# Patient Record
Sex: Male | Born: 1940 | Race: Black or African American | Hispanic: No | Smoking: Never smoker
Health system: Southern US, Community
[De-identification: ages and names within clinical notes are randomized; demographics above are authoritative.]

## PROBLEM LIST (undated history)

## (undated) DIAGNOSIS — R251 Tremor, unspecified: Secondary | ICD-10-CM

## (undated) DIAGNOSIS — I10 Essential (primary) hypertension: Secondary | ICD-10-CM

## (undated) DIAGNOSIS — I493 Ventricular premature depolarization: Secondary | ICD-10-CM

## (undated) DIAGNOSIS — E119 Type 2 diabetes mellitus without complications: Secondary | ICD-10-CM

## (undated) DIAGNOSIS — N182 Chronic kidney disease, stage 2 (mild): Secondary | ICD-10-CM

## (undated) DIAGNOSIS — F419 Anxiety disorder, unspecified: Secondary | ICD-10-CM

## (undated) DIAGNOSIS — R079 Chest pain, unspecified: Secondary | ICD-10-CM

## (undated) HISTORY — DX: Anxiety disorder, unspecified: F41.9

## (undated) HISTORY — PX: HERNIA REPAIR: SHX51

## (undated) HISTORY — DX: Ventricular premature depolarization: I49.3

## (undated) HISTORY — DX: Chronic kidney disease, stage 2 (mild): N18.2

## (undated) HISTORY — DX: Chest pain, unspecified: R07.9

---

## 2001-02-06 ENCOUNTER — Encounter: Payer: Self-pay | Admitting: Emergency Medicine

## 2001-02-06 ENCOUNTER — Emergency Department (HOSPITAL_COMMUNITY): Admission: EM | Admit: 2001-02-06 | Discharge: 2001-02-06 | Payer: Self-pay | Admitting: Emergency Medicine

## 2010-06-09 ENCOUNTER — Emergency Department (HOSPITAL_COMMUNITY): Admission: EM | Admit: 2010-06-09 | Discharge: 2010-06-09 | Payer: Self-pay | Admitting: Emergency Medicine

## 2010-07-29 ENCOUNTER — Ambulatory Visit (HOSPITAL_COMMUNITY): Admission: RE | Admit: 2010-07-29 | Discharge: 2010-07-29 | Payer: Self-pay | Admitting: General Surgery

## 2011-03-07 LAB — CBC
HCT: 44.7 % (ref 39.0–52.0)
Hemoglobin: 14.5 g/dL (ref 13.0–17.0)
MCH: 22.8 pg — ABNORMAL LOW (ref 26.0–34.0)
MCHC: 32.4 g/dL (ref 30.0–36.0)
MCV: 70.4 fL — ABNORMAL LOW (ref 78.0–100.0)
Platelets: 164 10*3/uL (ref 150–400)
RBC: 6.35 MIL/uL — ABNORMAL HIGH (ref 4.22–5.81)
RDW: 14.5 % (ref 11.5–15.5)
WBC: 4 10*3/uL (ref 4.0–10.5)

## 2011-03-07 LAB — BASIC METABOLIC PANEL
BUN: 9 mg/dL (ref 6–23)
CO2: 32 mEq/L (ref 19–32)
Calcium: 9.7 mg/dL (ref 8.4–10.5)
Chloride: 103 mEq/L (ref 96–112)
Creatinine, Ser: 1.1 mg/dL (ref 0.4–1.5)
GFR calc Af Amer: >60 mL/min (ref >60)
GFR calc non Af Amer: >60 mL/min (ref >60)
Glucose, Bld: 117 mg/dL — ABNORMAL HIGH (ref 70–99)
Potassium: 4.1 mEq/L (ref 3.5–5.1)
Sodium: 141 mEq/L (ref 135–145)

## 2011-03-07 LAB — SURGICAL PCR SCREEN
MRSA, PCR: NEGATIVE
Staphylococcus aureus: NEGATIVE

## 2011-03-07 LAB — GLUCOSE, CAPILLARY
Glucose-Capillary: 146 mg/dL — ABNORMAL HIGH (ref 70–99)
Glucose-Capillary: 146 mg/dL — ABNORMAL HIGH (ref 70–99)

## 2011-03-09 LAB — DIFFERENTIAL
Lymphs Abs: 1.7 10*3/uL (ref 0.7–4.0)
Monocytes Relative: 13 % — ABNORMAL HIGH (ref 3–12)
Neutro Abs: 2.4 10*3/uL (ref 1.7–7.7)
Neutrophils Relative %: 51 % (ref 43–77)

## 2011-03-09 LAB — URINALYSIS, ROUTINE W REFLEX MICROSCOPIC
Ketones, ur: NEGATIVE mg/dL
Leukocytes, UA: NEGATIVE
Nitrite: NEGATIVE
Specific Gravity, Urine: 1.02 (ref 1.005–1.030)
Urobilinogen, UA: 1 mg/dL (ref 0.0–1.0)
pH: 6 (ref 5.0–8.0)

## 2011-03-09 LAB — COMPREHENSIVE METABOLIC PANEL
BUN: 12 mg/dL (ref 6–23)
CO2: 29 mEq/L (ref 19–32)
Calcium: 9.1 mg/dL (ref 8.4–10.5)
Chloride: 100 mEq/L (ref 96–112)
Creatinine, Ser: 1.2 mg/dL (ref 0.4–1.5)
GFR calc Af Amer: >60 mL/min (ref >60)
Potassium: 2.8 mEq/L — ABNORMAL LOW (ref 3.5–5.1)
Sodium: 138 mEq/L (ref 135–145)
Total Protein: 7.2 g/dL (ref 6.0–8.3)

## 2011-03-09 LAB — GLUCOSE, CAPILLARY: Glucose-Capillary: 221 mg/dL — ABNORMAL HIGH (ref 70–99)

## 2011-03-09 LAB — URINE MICROSCOPIC-ADD ON

## 2011-03-09 LAB — CBC
MCV: 71.7 fL — ABNORMAL LOW (ref 78.0–100.0)
Platelets: 162 10*3/uL (ref 150–400)
RBC: 6.01 MIL/uL — ABNORMAL HIGH (ref 4.22–5.81)
WBC: 4.8 10*3/uL (ref 4.0–10.5)

## 2011-03-09 LAB — LACTIC ACID, PLASMA: Lactic Acid, Venous: 2.6 mmol/L — ABNORMAL HIGH (ref 0.5–2.2)

## 2013-11-14 ENCOUNTER — Other Ambulatory Visit: Payer: Self-pay | Admitting: Family Medicine

## 2013-11-14 ENCOUNTER — Ambulatory Visit
Admission: RE | Admit: 2013-11-14 | Discharge: 2013-11-14 | Disposition: A | Discharge status: Home / Self Care | Payer: Medicare Other | Source: Ambulatory Visit | Attending: Family Medicine | Admitting: Family Medicine

## 2013-11-14 DIAGNOSIS — R079 Chest pain, unspecified: Secondary | ICD-10-CM

## 2015-04-21 ENCOUNTER — Emergency Department (HOSPITAL_COMMUNITY)
Admission: EM | Admit: 2015-04-21 | Discharge: 2015-04-21 | Disposition: A | Discharge status: Home / Self Care | Payer: Medicare Other | Attending: Emergency Medicine | Admitting: Emergency Medicine

## 2015-04-21 ENCOUNTER — Emergency Department (HOSPITAL_COMMUNITY): Payer: Medicare Other

## 2015-04-21 ENCOUNTER — Encounter (HOSPITAL_COMMUNITY): Payer: Self-pay | Admitting: Emergency Medicine

## 2015-04-21 DIAGNOSIS — E119 Type 2 diabetes mellitus without complications: Secondary | ICD-10-CM | POA: Insufficient documentation

## 2015-04-21 DIAGNOSIS — Z79899 Other long term (current) drug therapy: Secondary | ICD-10-CM | POA: Insufficient documentation

## 2015-04-21 DIAGNOSIS — I10 Essential (primary) hypertension: Secondary | ICD-10-CM | POA: Insufficient documentation

## 2015-04-21 DIAGNOSIS — R109 Unspecified abdominal pain: Secondary | ICD-10-CM | POA: Diagnosis present

## 2015-04-21 DIAGNOSIS — M545 Low back pain: Secondary | ICD-10-CM | POA: Insufficient documentation

## 2015-04-21 DIAGNOSIS — Z794 Long term (current) use of insulin: Secondary | ICD-10-CM | POA: Insufficient documentation

## 2015-04-21 HISTORY — DX: Essential (primary) hypertension: I10

## 2015-04-21 HISTORY — DX: Type 2 diabetes mellitus without complications: E11.9

## 2015-04-21 HISTORY — DX: Tremor, unspecified: R25.1

## 2015-04-21 LAB — URINALYSIS, ROUTINE W REFLEX MICROSCOPIC
BILIRUBIN URINE: NEGATIVE
Glucose, UA: >1000 mg/dL — AB
Hgb urine dipstick: NEGATIVE
KETONES UR: NEGATIVE mg/dL
Leukocytes, UA: NEGATIVE
NITRITE: NEGATIVE
PROTEIN: NEGATIVE mg/dL
Specific Gravity, Urine: 1.014 (ref 1.005–1.030)
UROBILINOGEN UA: 0.2 mg/dL (ref 0.0–1.0)
pH: 6 (ref 5.0–8.0)

## 2015-04-21 LAB — CBC
HEMATOCRIT: 43.5 % (ref 39.0–52.0)
HEMOGLOBIN: 14.4 g/dL (ref 13.0–17.0)
MCH: 22.9 pg — ABNORMAL LOW (ref 26.0–34.0)
MCHC: 33.1 g/dL (ref 30.0–36.0)
MCV: 69.3 fL — ABNORMAL LOW (ref 78.0–100.0)
Platelets: 183 10*3/uL (ref 150–400)
RBC: 6.28 MIL/uL — AB (ref 4.22–5.81)
RDW: 15.2 % (ref 11.5–15.5)
WBC: 4.5 10*3/uL (ref 4.0–10.5)

## 2015-04-21 LAB — URINE MICROSCOPIC-ADD ON: Urine-Other: NONE SEEN

## 2015-04-21 LAB — BASIC METABOLIC PANEL
Anion gap: 5 (ref 5–15)
BUN: 16 mg/dL (ref 6–23)
CO2: 26 mmol/L (ref 19–32)
CREATININE: 1.26 mg/dL (ref 0.50–1.35)
Calcium: 8.8 mg/dL (ref 8.4–10.5)
Chloride: 101 mmol/L (ref 96–112)
GFR, EST AFRICAN AMERICAN: 63 mL/min — AB (ref >90)
GFR, EST NON AFRICAN AMERICAN: 54 mL/min — AB (ref >90)
GLUCOSE: 239 mg/dL — AB (ref 70–99)
POTASSIUM: 5.6 mmol/L — AB (ref 3.5–5.1)
Sodium: 132 mmol/L — ABNORMAL LOW (ref 135–145)

## 2015-04-21 MED ORDER — NAPROXEN 500 MG PO TABS: 500.0000 mg | ORAL_TABLET | Freq: Two times a day (BID) | ORAL | Status: DC

## 2015-04-21 NOTE — ED Provider Notes (Signed)
CSN: [id number]     Arrival date & time 04/21/15  1235 History   First MD Initiated Contact with Patient 04/21/15 1305     Chief Complaint  Patient presents with  . Flank Pain     (Consider location/radiation/quality/duration/timing/severity/associated sxs/prior Treatment) HPI Comments: The patient is a 74 year old male, he has a history of hypertension and diabetes, he presents with right-sided lower back pain which has been going on for just over 1 week. He denies any urinary symptoms, denies fevers chills nausea vomiting or diarrhea, the pain seems to get better during the day when he takes pain medications, he gets worse when he lays down at night. There is no associated difficulty ambulating, urinary retention, no history of cancer, no fevers, no pain or numbness in his legs.  Patient is a 74 y.o. male presenting with flank pain. The history is provided by the patient.  Flank Pain    Past Medical History  Diagnosis Date  . Hypertension   . Diabetes mellitus without complication   . Occasional tremors    Past Surgical History  Procedure Laterality Date  . Hernia repair     No family history on file. History  Substance Use Topics  . Smoking status: Never Smoker   . Smokeless tobacco: Not on file  . Alcohol Use: No    Review of Systems  Genitourinary: Positive for flank pain.  All other systems reviewed and are negative.     Allergies  Review of patient's allergies indicates no known allergies.  Home Medications   Prior to Admission medications   Medication Sig Start Date End Date Taking? Authorizing Provider  amLODipine (NORVASC) 10 MG tablet Take 10 mg by mouth daily. 03/29/15  Yes Historical Provider, MD  clonazePAM (KLONOPIN) 1 MG tablet Take 1 mg by mouth 2 (two) times daily. 03/29/15  Yes Historical Provider, MD  insulin detemir (LEVEMIR) 100 UNIT/ML injection Inject 35 Units into the skin daily.   Yes Historical Provider, MD  losartan (COZAAR) 100 MG tablet  Take 100 mg by mouth daily.  03/29/15  Yes Historical Provider, MD  naproxen (NAPROSYN) 500 MG tablet Take 1 tablet (500 mg total) by mouth 2 (two) times daily with a meal. 04/21/15   Eber HongBrian Natane Heward, MD   BP 148/70 mmHg  Pulse 83  Temp(Src) 98 F (36.7 C)  Resp 18  SpO2 100% Physical Exam  Constitutional: He appears well-developed and well-nourished. No distress.  HENT:  Head: Normocephalic and atraumatic.  Mouth/Throat: Oropharynx is clear and moist. No oropharyngeal exudate.  Eyes: Conjunctivae and EOM are normal. Pupils are equal, round, and reactive to light. Right eye exhibits no discharge. Left eye exhibits no discharge. No scleral icterus.  Neck: Normal range of motion. Neck supple. No JVD present. No thyromegaly present.  Cardiovascular: Normal rate, regular rhythm, normal heart sounds and intact distal pulses.  Exam reveals no gallop and no friction rub.   No murmur heard. Pulmonary/Chest: Effort normal and breath sounds normal. No respiratory distress. He has no wheezes. He has no rales.  Abdominal: Soft. Bowel sounds are normal. He exhibits no distension and no mass. There is no tenderness.  No palpable abdominal aneurysm or other mass, no tenderness  Musculoskeletal: Normal range of motion. He exhibits tenderness (tender to palpation over the right flank and lower back). He exhibits no edema.  Lymphadenopathy:    He has no cervical adenopathy.  Neurological: He is alert. Coordination normal.  Skin: Skin is warm and dry. No rash noted.  No erythema.  Psychiatric: He has a normal mood and affect. His behavior is normal.  Nursing note and vitals reviewed.   ED Course  Procedures (including critical care time) Labs Review Labs Reviewed  URINALYSIS, ROUTINE W REFLEX MICROSCOPIC - Abnormal; Notable for the following:    Glucose, UA >1000 (*)    All other components within normal limits  CBC - Abnormal; Notable for the following:    RBC 6.28 (*)    MCV 69.3 (*)    MCH 22.9 (*)     All other components within normal limits  BASIC METABOLIC PANEL - Abnormal; Notable for the following:    Sodium 132 (*)    Potassium 5.6 (*)    Glucose, Bld 239 (*)    GFR calc non Af Amer 54 (*)    GFR calc Af Amer 63 (*)    All other components within normal limits  URINE MICROSCOPIC-ADD ON    Imaging Review Ct Renal Stone Study  04/21/2015   CLINICAL DATA:  Bilateral flank pain, right greater than left, x1 week  EXAM: CT ABDOMEN AND PELVIS WITHOUT CONTRAST  TECHNIQUE: Multidetector CT imaging of the abdomen and pelvis was performed following the standard protocol without IV contrast.  COMPARISON:  06/09/2010  FINDINGS: Lower chest:  Mild dependent atelectasis the lung bases.  Hepatobiliary: 2.5 cm cyst in the lateral segment left hepatic lobe (series 2/image 14), previously 1.4 cm, benign.  Gallbladder is unremarkable. No intrahepatic or extrahepatic ductal dilatation.  Pancreas: Within normal limits.  Spleen: Within normal limits.  Adrenals/Urinary Tract: Adrenal glands are within normal limits.  1.8 cm probable cyst in the posterior left upper kidney (series 2/ image 25). Additional 1.2 cm probable cyst in the posterior left upper pole (series 2/image 19). Right kidney is unremarkable.  No renal, ureteral, or bladder calculi.  No hydronephrosis.  Bladder is mildly thick-walled although underdistended.  Stomach/Bowel: Stomach is within normal limits.  No evidence of bowel obstruction.  Normal appendix.  Vascular/Lymphatic: New mild atherosclerotic calcifications of the abdominal aorta.  No suspicious abdominopelvic lymphadenopathy.  Reproductive: Prostatomegaly, with enlargement of the central gland which indents the base of the bladder.  Other: No abdominopelvic ascites.  Fat within the bilateral inguinal canals.  Musculoskeletal: Degenerative changes of the visualized thoracolumbar spine.  IMPRESSION: No renal, ureteral, or bladder calculi.  No hydronephrosis.  No evidence of bowel  obstruction.  Normal appendix.  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  No CT findings to account for the patient's bilateral flank pain.   Electronically Signed   By: Charline Bills M.D.   On: 04/21/2015 14:22      MDM   Final diagnoses:  Flank pain    Will need to evaluate for kidney stone, AAA, possibly musculoskeletal pain, the patient has a normal neurologic exam and has no midline tenderness or risk factors for pathologic back pain other than age.  CT scan negative for acute findings, labs unremarkable, the patient appears stable for discharge  Meds given in ED:  Medications - No data to display  New Prescriptions   NAPROXEN (NAPROSYN) 500 MG TABLET    Take 1 tablet (500 mg total) by mouth 2 (two) times daily with a meal.      Eber Hong, MD 04/21/15 [phone]

## 2015-04-21 NOTE — ED Notes (Signed)
Per pt, states flank pain for over a week-no dysuria

## 2015-04-21 NOTE — Discharge Instructions (Signed)
Back Pain: ° ° °Your back pain should be treated with medicines such as ibuprofen or aleve and this back pain should get better over the next 2 weeks.  However if you develop severe or worsening pain, low back pain with fever, numbness, weakness or inability to walk or urinate, you should return to the ER immediately.  Please follow up with your doctor this week for a recheck if still having symptoms. °Low back pain is discomfort in the lower back that may be due to injuries to muscles and ligaments around the spine.  Occasionally, it may be caused by a a problem to a part of the spine called a disc.  The pain may last several days or a week;  However, most patients get completely well in 4 weeks. ° °Self - care:  The application of heat can help soothe the pain.  Maintaining your daily activities, including walking, is encourged, as it will help you get better faster than just staying in bed. ° °Medications are also useful to help with pain control.  A commonly prescribed medications includes acetaminophen.  This medication is generally safe, though you should not take more than 8 of the extra strength (500mg) pills a day. ° °Non steroidal anti inflammatory medications including Ibuprofen and naproxen;  These medications help both pain and swelling and are very useful in treating back pain.  They should be taken with food, as they can cause stomach upset, and more seriously, stomach bleeding.   ° °Muscle relaxants:  These medications can help with muscle tightness that is a cause of lower back pain.  Most of these medications can cause drowsiness, and it is not safe to drive or use dangerous machinery while taking them. ° °You will need to follow up with  Your primary healthcare provider in 1-2 weeks for reassessment. ° °Be aware that if you develop new symptoms, such as a fever, leg weakness, difficulty with or loss of control of your urine or bowels, abdominal pain, or more severe pain, you will need to seek  medical attention and  / or return to the Emergency department. ° °If you do not have a doctor see the list below. ° °RESOURCE GUIDE ° °Chronic Pain Problems: °Contact [facility]  [phone] °Patients need to be referred by their primary care doctor. ° °Insufficient Money for Medicine: °Contact United Way:  call "211" or [facility] 271-5999. ° °No Primary Care Doctor: °- Call [facility]  832-8000 - can help you locate a primary care doctor that  accepts your insurance, provides certain services, etc. °- Physician Referral Service- 1-800-533-3463 ° °Agencies that provide inexpensive medical care: °- [facility]  [phone] °-  Internal Medicine  [phone] °- [facility]  [phone] °- Women's Clinic  832-4777 °- [facility]  [phone] °- [facility]  [phone] ° °Medicaid-accepting [place] Providers: °- [facility]- [address] ° [phone], Mon-Fri 9am-7pm, Sat 9am-1pm °- [facility]- [address], [address] ° [phone] °- [facility]- [address], [address] ° [phone] °- [facility]- [address] [place] [address] ° [phone] °- [facility]- [address], [phone] ° Only accepts [place] Access Medicaid patients after they have their name  applied to their card ° °Self Pay (no insurance) in [place]: °- Sickle Cell Patients: Dr Eric Dean, [facility] °   [address], [phone] °- [facility]- [address] ° [phone] °      -     [facility] [place]- [address] °      -     [facility]- see information above (Speak to Pam H if you do not have insurance) °      -  [facility]- [address], [phone] °      -  [facility] [place]- [address],  [phone] °      -  [facility]- [address] [place] [address],  [phone] °      -  Dr Osei-Bonsu-  [address], [place], [phone] °      -  [facility]- [address], [phone] °      -  [facility]- [address] [place] [address], [phone], also [address], [phone] °      -    [facility]- [address], [phone], 1st & 3rd Saturday   every month, 10am-1pm ° °1) Find a Doctor and Pay Out of Pocket °Although you won't have to find out who is covered by your insurance plan, it is a good idea to ask around and get recommendations. You will then need to call the office and see if the doctor you have chosen will accept you as a new patient and what types of options they offer for patients who are self-pay. Some doctors offer discounts or will set up payment plans for their patients who do not have insurance, but you will need to ask so you aren't surprised when you get to your appointment. ° °2) Contact Your Local Health Department °Not all health departments have doctors that can see patients for sick visits, but many do, so it is worth a call to see if yours does. If you don't know where your local health department is, you can check in your phone book. The CDC also has a tool to help you locate your state's health department, and many state websites also have listings of all of their local health departments. ° °3) Find a Walk-in Clinic °If your illness is not likely to be very severe or complicated, you may want to try a walk in clinic. These are popping up all over the country in pharmacies, drugstores, and shopping centers. They're usually staffed by nurse practitioners or physician assistants that have been trained to treat common illnesses and complaints. They're usually fairly quick and inexpensive. However, if you have serious medical issues or chronic medical problems, these are probably not your best option ° °STD Testing °- [place] [facility] [place], [facility], [address], [place], phone [phone] or 1-877-539-9860.  Monday - Friday, call for an appointment. °- [place] [facility] [place], [facility], [address], [place], phone [phone] or 1-877-539-9860.  Monday - Friday, call for an appointment. ° °Abuse/Neglect: °- [place] Child Abuse Hotline (336) 641-3795 °- [place] Child Abuse Hotline 800-378-5315 (After Hours) ° °Emergency Shelter:  [facility] (336) 271-5985 ° °Maternity Homes: °- [facility] (336) 275-9566 °- [facility] (704) 372-4663 ° °MRSA Hotline #:   832-7006 ° °[place] Resources ° °[facility] [place]  [employer] [place] [facility] °[address]                 [address]         [address]  °[place]                                               [place]                              [place] °Phone:  349-3220                                  Phone:  342-7768                   Phone:  342-8140 ° °[facility], 342-8316 °- [place] [facility] - [facility]- 1-888-581-9988 °      -     [facility] in [place], [address],                                  336-349-4454, Insurance ° °[place] Child Abuse Hotline °(336) 342-1394 or (336) 342-3537 (After Hours) ° ° °Behavioral Health Services ° °Substance Abuse Resources: °- [facility]  336-882-2125 °- [facility] 336-784-9470 °- [facility] 336-285-9073 °- [facility] 336-845-3988 °- Residential & Outpatient Substance Abuse Program  800-659-3381 ° °Psychological Services: °- [facility]  832-9600 °- [facility]  378-7881 °- [facility], [address], [place], ACCESS LINE: 1-800-853-5163 or 336-641-4981, Http://www.[facility]/services/adult.htm ° °Dental Assistance ° °If unable to pay or  uninsured, contact:  [facility] or [place] [facility] to become qualified for the adult dental clinic. ° °Patients with Medicaid: [facility] °[address], [phone] °[address], [phone] ° °If unable to pay, or uninsured, contact [facility] (271-5999) or [place] [facility] (641-3152 in [place], 842-7733 in [place]) to become qualified for the adult dental clinic ° °Other Low-Cost Community Dental Services: °- [facility]- [address], [place], [place], [address], 723-1848, Ext. [address], 2nd and 4th Thursday of the month at 6:30am.  10 clients each day by appointment, can sometimes see walk-in patients if someone does not show for an appointment. °- [facility]- [address], [place], [place], [address], [phone] °- [facility]- [address], [place], , [address], [phone] °- [facility] [facility]- 342-8273 °- [facility]- 703-3100 °- [facility]- 570-6415 ° ° ° ° ° ° °

## 2017-07-15 ENCOUNTER — Telehealth: Payer: Self-pay

## 2017-07-15 NOTE — Telephone Encounter (Signed)
NOTES SENT TO SCHELDING 

## 2017-07-27 ENCOUNTER — Encounter: Payer: Self-pay | Admitting: Cardiology

## 2017-08-13 ENCOUNTER — Encounter: Payer: Self-pay | Admitting: Cardiology

## 2017-08-13 ENCOUNTER — Ambulatory Visit (INDEPENDENT_AMBULATORY_CARE_PROVIDER_SITE_OTHER): Payer: Medicare Other | Admitting: Cardiology

## 2017-08-13 VITALS — BP 146/92 | HR 80 | Ht 72.0 in | Wt 196.0 lb

## 2017-08-13 DIAGNOSIS — R42 Dizziness and giddiness: Secondary | ICD-10-CM

## 2017-08-13 DIAGNOSIS — Z794 Long term (current) use of insulin: Secondary | ICD-10-CM

## 2017-08-13 DIAGNOSIS — R002 Palpitations: Secondary | ICD-10-CM

## 2017-08-13 DIAGNOSIS — E119 Type 2 diabetes mellitus without complications: Secondary | ICD-10-CM

## 2017-08-13 DIAGNOSIS — E782 Mixed hyperlipidemia: Secondary | ICD-10-CM | POA: Diagnosis not present

## 2017-08-13 DIAGNOSIS — R079 Chest pain, unspecified: Secondary | ICD-10-CM

## 2017-08-13 DIAGNOSIS — I10 Essential (primary) hypertension: Secondary | ICD-10-CM | POA: Diagnosis not present

## 2017-08-13 NOTE — Progress Notes (Signed)
.   Cardiology Office Note   Date:  08/13/2017   ID:  [id number] DOB October 14, 1941, MRN [id number]  PCP:  [facility] [place] [facility] Laurann Montana Cardiologist:  [facility]     Chief Complaint  Patient presents with  . Chest Pain    dizziness  . Palpitations      History of Present Illness: Ronald Ramirez is a 76 y.o. male who is being seen today for the evaluation of irregular HR  at the request of Laurann Montana, MD.   He has been having irregular HR for 20 years.  Described as skipped beats.   Also some chest pain.  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  Hx of anxiety.  HLD, BPH.   Today he has no complaints,but is here for 2 episodes of irregular rapid HR and dizziness associated with chest pain.  He pulled over both times and rested and symptoms improved.  No syncope.  He feels he had episodes because his klonopin had been changed.  Now back on good klonopin.     Mother died of old age at 48 and father died at 12 from overwork, but more of a sudden death, but he is not sure about details.    Currently without SOB or chest pain.  He did have nuc stress test in 2009 that was normal.   No flowsheet data found.     Past Medical History:  Diagnosis Date  . Anxiety   . Chest pain    NUC stress 11/09- overall low risk, no ishemia, normal EF, exercise time 5:01, NSSSTW changes  . Diabetes mellitus without complication (HCC)   . Hypertension   . Occasional tremors     Past Surgical History:  Procedure Laterality Date  . HERNIA REPAIR       Current Outpatient Prescriptions  Medication Sig Dispense Refill  . amLODipine (NORVASC) 10 MG tablet Take 10 mg by mouth daily.  3  . atorvastatin (LIPITOR) 20 MG tablet Take 20 mg by mouth daily.    . clonazePAM (KLONOPIN) 1 MG tablet Take 1 mg by mouth 2 (two) times daily.  0  . insulin lispro protamine-lispro (HUMALOG 75/25 MIX) (75-25) 100 UNIT/ML SUSP injection Inject 20 Units into the skin 2 (two)  times daily with a meal.    . losartan (COZAAR) 100 MG tablet Take 100 mg by mouth daily.   6   No current facility-administered medications for this visit.     Allergies:   Lantus [insulin glargine] and Metformin and related    Social History:  The patient  reports that he has never smoked. He has never used smokeless tobacco. He reports that he does not drink alcohol or use drugs.   Family History:  The patient's family history includes Bleeding Disorder in his sister.    ROS:  General:no colds or fevers, no weight changes Skin:no rashes or ulcers HEENT:no blurred vision, no congestion CV:see HPI PUL:see HPI GI:no diarrhea constipation or melena, no indigestion GU:no hematuria, no dysuria MS:no joint pain, no claudication Neuro:no syncope, + lightheadedness Endo:+ diabetes, no thyroid disease  Wt Readings from Last 3 Encounters:  08/13/17 196 lb (88.9 kg)     PHYSICAL EXAM: VS:  BP (!) 146/92   Pulse 80   Ht 6' (1.829 m)   Wt 196 lb (88.9 kg)   BMI 26.58 kg/m  , BMI Body mass index is 26.58 kg/m. General:Pleasant affect, NAD Skin:Warm and dry, brisk  capillary refill HEENT:normocephalic, sclera clear, mucus membranes moist, poor dentation Neck:supple, no JVD, no bruits  Heart:S1S2 RRR without murmur, gallup, rub or click Lungs:clear without rales, rhonchi, or wheezes JKD:TOIZ, non tender, + BS, do not palpate liver spleen or masses Ext:no lower ext edema, 2+ pedal pulses, 2+ radial pulses Neuro:alert and oriented X 3, MAE, follows commands, + facial symmetry    EKG:  EKG is NOT ordered today. The ekg 07/14/17  From PCP with SR non specific T wave abnormality.   Recent Labs: No results found for requested labs within last 8760 hours.    Lipid Panel No results found for: CHOL, TRIG, HDL, CHOLHDL, VLDL, LDLCALC, LDLDIRECT   Labs 06/26/17 with HDL 35, LDL 62, A1C 8.6, K+ 4.0    Other studies Reviewed: Additional studies/ records that were reviewed today  include: previous office note..   ASSESSMENT AND PLAN:  1.  Irregular HR more frequent associated with dizziness and chest pain X 2.  Will have pt wear 30 day event monitor.  2.   Chest pain with irregular HR will do Echo to further eval.  If event monitor and echo normal will do stress test to eval for ischemia.  He does have DM, HTN and HLD  If abnormal echo may need cath.  Pt will follow up after studies.   3.  HTN will monitor  4.  HLD per PCP  5.  DM-2 on insulin followed by PCP      Current medicines are reviewed with the patient today.  The patient Has no concerns regarding medicines.  The following changes have been made:  See above Labs/ tests ordered today include:see above  Disposition:   FU:  see above  Signed, Nada Boozer, NP  08/13/2017 3:29 PM    [facility] [address] [place], [place], [place]  [address]/ [address] [place], [place] Phone: [phone]; Fax: [phone]  [phone]

## 2017-08-13 NOTE — Patient Instructions (Signed)
Medication Instructions:  Your provider recommends that you continue on your current medications as directed. Please refer to the Current Medication list given to you today.    Labwork: None  Testing/Procedures: Your provider has requested that you have an echocardiogram. Echocardiography is a painless test that uses sound waves to create images of your heart. It provides your doctor with information about the size and shape of your heart and how well your heart's chambers and valves are working. This procedure takes approximately one hour. There are no restrictions for this procedure.   Your physician has recommended that you wear an event monitor. Event monitors are medical devices that record the heart's electrical activity. Doctors most often [place] these monitors to diagnose arrhythmias. Arrhythmias are problems with the speed or rhythm of the heartbeat. The monitor is a small, portable device. You can wear one while you do your normal daily activities. This is usually used to diagnose what is causing palpitations/syncope (passing out).  Follow-Up: Your provider recommends that you schedule a follow-up appointment with Dr. Okey Dupre when your monitor is completed.  Any Other Special Instructions Will Be Listed Below (If Applicable).     If you need a refill on your cardiac medications before your next appointment, please call your pharmacy.

## 2017-09-01 ENCOUNTER — Ambulatory Visit (INDEPENDENT_AMBULATORY_CARE_PROVIDER_SITE_OTHER): Payer: Medicare Other

## 2017-09-01 ENCOUNTER — Ambulatory Visit (HOSPITAL_COMMUNITY): Payer: Medicare Other | Attending: Cardiology

## 2017-09-01 ENCOUNTER — Other Ambulatory Visit: Payer: Self-pay

## 2017-09-01 DIAGNOSIS — R002 Palpitations: Secondary | ICD-10-CM | POA: Diagnosis not present

## 2017-09-01 DIAGNOSIS — R42 Dizziness and giddiness: Secondary | ICD-10-CM | POA: Diagnosis not present

## 2017-09-01 DIAGNOSIS — I517 Cardiomegaly: Secondary | ICD-10-CM | POA: Diagnosis not present

## 2017-09-01 DIAGNOSIS — R079 Chest pain, unspecified: Secondary | ICD-10-CM

## 2017-10-19 ENCOUNTER — Ambulatory Visit (INDEPENDENT_AMBULATORY_CARE_PROVIDER_SITE_OTHER): Payer: Medicare Other | Admitting: Internal Medicine

## 2017-10-19 ENCOUNTER — Encounter: Payer: Self-pay | Admitting: Internal Medicine

## 2017-10-19 VITALS — BP 142/70 | HR 74 | Ht 72.0 in | Wt 197.0 lb

## 2017-10-19 DIAGNOSIS — I10 Essential (primary) hypertension: Secondary | ICD-10-CM | POA: Diagnosis not present

## 2017-10-19 DIAGNOSIS — R0602 Shortness of breath: Secondary | ICD-10-CM

## 2017-10-19 DIAGNOSIS — R002 Palpitations: Secondary | ICD-10-CM | POA: Diagnosis not present

## 2017-10-19 DIAGNOSIS — I493 Ventricular premature depolarization: Secondary | ICD-10-CM | POA: Diagnosis not present

## 2017-10-19 DIAGNOSIS — R0789 Other chest pain: Secondary | ICD-10-CM

## 2017-10-19 NOTE — Progress Notes (Signed)
Follow-up Outpatient Visit Date: 10/19/2017  Primary Care Provider: Darrin Nipperollege, [facility] [address] [place] [id number]  Chief Complaint: Follow-up irregular heartbeat, dizziness, and chest pain  HPI:  [name] is a 76 y.o. year-old male with history of hypertension hyperlipidemia, diabetes mellitus, and anxiety, who presents for follow-up of palpitations, dizziness, and chest pain.  He was seen initially in August by Nada BoozerLaura Ingold.  He was referred for echo and event monitor (see details below).  Today, [name] reports that he has been feeling better than at his last visit.  He attributes this to an increase in clonazepam from 1->1.5 mg twice daily by his PCP.  He has not needed to take his as needed propranolol much since Was increased.  [name] continues to have rare episodes of shortness of breath.  His only event since his last visit occurred while pushing a car.  He notes occasional brief chest pains that are not exertional and only last a few minutes at a time.  He describes them as mild without accompanying symptoms.  They are now happening once or twice a week been present for months if not years.  He continues has occasional "pauses" in his heartbeat.  He has felt this for many years and does not leave it is getting any worse.  He has no associated symptoms, including chest pain, shortness of breath, and lightheadedness.  --------------------------------------------------------------------------------------------------  Cardiovascular History & Procedures: Cardiovascular Problems:  Palpitations  Chest pain  Risk Factors:  Hypertension, hyperlipidemia, diabetes mellitus, male gender, and age greater than 3455  Cath/PCI:  None  CV Surgery:  None  EP Procedures and Devices:  30-day event monitor (09/01/17): Sinus rhythm with PVCs, including ventricular trigeminy.  Patient reported symptoms correspond to PVCs.  Non-Invasive  Evaluation(s):  TTE (09/01/17): Normal LV size with moderate focal basal hypertrophy of the septum.  LVEF 55-60% with normal regional wall motion.  Grade 1 diastolic dysfunction.  Trileaflet, mildly thickened, mildly calcified aortic valve with trivial regurgitation.  Normal RV size and function.  Recent CV Pertinent Labs: Lab Results  Component Value Date   K 5.6 (H) 04/21/2015   BUN 16 04/21/2015   CREATININE 1.26 04/21/2015    Past medical and surgical history were reviewed and updated in EPIC.  Current Meds  Medication Sig  . amLODipine (NORVASC) 10 MG tablet Take 10 mg by mouth daily.  Marland Kitchen. atorvastatin (LIPITOR) 20 MG tablet Take 20 mg by mouth once a week.   . clonazePAM (KLONOPIN) 1 MG tablet Take 1 mg by mouth 2 (two) times daily.  . insulin lispro protamine-lispro (HUMALOG 75/25 MIX) (75-25) 100 UNIT/ML SUSP injection Inject 20 Units into the skin 2 (two) times daily with a meal.  . losartan (COZAAR) 100 MG tablet Take 100 mg by mouth daily.   . propranolol (INDERAL) 20 MG tablet 1 TABLET TWICE A DAY AS NEEDED FOR SHAKINESS AND PALPITATIONS ORALLY 30 DAY(S)    Allergies: Lantus [insulin glargine] and Metformin and related  Social History   Social History  . Marital status: Married    Spouse name: N/A  . Number of children: N/A  . Years of education: N/A   Occupational History  . Not on file.   Social History Main Topics  . Smoking status: Never Smoker  . Smokeless tobacco: Never Used  . Alcohol use No  . Drug use: No  . Sexual activity: Not on file   Other Topics Concern  . Not  on file   Social History Narrative  . No narrative on file    Family History  Problem Relation Age of Onset  . Bleeding Disorder Sister     Review of Systems: A 12-system review of systems was performed and was negative except as noted in the HPI.  --------------------------------------------------------------------------------------------------  Physical Exam: BP (!) 142/70    Pulse 74   Ht 6' (1.829 m)   Wt 197 lb (89.4 kg)   SpO2 95%   BMI 26.72 kg/m   General: Well-developed, well-nourished man, seated comfortably in the exam room.  He is accompanied by his wife. HEENT: No conjunctival pallor or scleral icterus. Moist mucous membranes.  OP clear with poor dentition. Neck: Supple without lymphadenopathy, thyromegaly, JVD, or HJR. No carotid bruit. Lungs: Normal work of breathing. Clear to auscultation bilaterally without wheezes or crackles. Heart: Regular rate and rhythm with occasional extrasystoles.  No murmurs, rubs, or gallops. Non-displaced PMI. Abd: Bowel sounds present. Soft, NT/ND without hepatosplenomegaly Ext: No lower extremity edema. Radial, PT, and DP pulses are 2+ bilaterally. Skin: Warm and dry without rash.   Lab Results  Component Value Date   WBC 4.5 04/21/2015   HGB 14.4 04/21/2015   HCT 43.5 04/21/2015   MCV 69.3 (L) 04/21/2015   PLT 183 04/21/2015    Lab Results  Component Value Date   NA 132 (L) 04/21/2015   K 5.6 (H) 04/21/2015   CL 101 04/21/2015   CO2 26 04/21/2015   BUN 16 04/21/2015   CREATININE 1.26 04/21/2015   GLUCOSE 239 (H) 04/21/2015   ALT 12 06/09/2010    No results found for: CHOL, HDL, LDLCALC, LDLDIRECT, TRIG, CHOLHDL  --------------------------------------------------------------------------------------------------  ASSESSMENT AND PLAN: Palpitations and PVCs Palpitations have been long-standing.  Recent event monitor revealed episodes of PVCs including ventricular trigeminy.  Outside labs showed no evidence of significant hypokalemia earlier this year.  Echo demonstrated preserved EF with focal basal hypertrophy of the septum.  We have agreed to obtain a stress test to exclude underlying myocardial ischemia.  Shortness of breath and atypical chest pain Symptoms are sporadic and long-standing.  However, in light of the PVCs and multiple cardiac risk factors, I have recommended ischemia evaluation.  We  have agreed to perform an exercise myocardial perfusion stress test.  I will not make any medication changes at this time, pending results of the stress test.  Pretension Blood pressure mildly elevated today.  I will defer medication changes to Mr. Heisler PCP.  Follow-up: Return to clinic in 1 month.  Yvonne Kendall, MD 10/19/2017 3:35 PM

## 2017-10-19 NOTE — Patient Instructions (Signed)
Medication Instructions:  Your physician recommends that you continue on your current medications as directed. Please refer to the Current Medication list given to you today.   Labwork: None   Testing/Procedures: Your physician has requested that you have en exercise stress myoview. For further information please visit [email or web address]. Please follow instruction sheet, as given.    Follow-Up: Your physician recommends that you schedule a follow-up appointment in: 1 month with Dr End.          If you need a refill on your cardiac medications before your next appointment, please call your pharmacy.

## 2017-10-20 ENCOUNTER — Encounter: Payer: Self-pay | Admitting: Internal Medicine

## 2017-10-20 DIAGNOSIS — I10 Essential (primary) hypertension: Secondary | ICD-10-CM | POA: Insufficient documentation

## 2017-10-20 DIAGNOSIS — R0602 Shortness of breath: Secondary | ICD-10-CM | POA: Insufficient documentation

## 2017-10-20 DIAGNOSIS — R0789 Other chest pain: Secondary | ICD-10-CM | POA: Insufficient documentation

## 2017-10-20 DIAGNOSIS — I493 Ventricular premature depolarization: Secondary | ICD-10-CM | POA: Insufficient documentation

## 2017-10-20 DIAGNOSIS — R002 Palpitations: Secondary | ICD-10-CM | POA: Insufficient documentation

## 2017-10-26 ENCOUNTER — Telehealth (HOSPITAL_COMMUNITY): Payer: Self-pay | Admitting: *Deleted

## 2017-10-26 NOTE — Telephone Encounter (Signed)
Left message on voicemail in reference to upcoming appointment scheduled for 10/28/17. Phone number given for a call back so details instructions can be given.  Ronald Ramirez   

## 2017-10-27 ENCOUNTER — Telehealth (HOSPITAL_COMMUNITY): Payer: Self-pay | Admitting: *Deleted

## 2017-10-27 NOTE — Telephone Encounter (Signed)
Patient given detailed instructions per Myocardial Perfusion Study Information Sheet for the test on 10/28/17 at 1000. Patient notified to arrive 15 minutes early and that it is imperative to arrive on time for appointment to keep from having the test rescheduled.  If you need to cancel or reschedule your appointment, please call the office within 24 hours of your appointment. . Patient verbalized understanding.Maureena Dabbs, Adelene IdlerCynthia W

## 2017-10-28 ENCOUNTER — Ambulatory Visit (HOSPITAL_COMMUNITY): Payer: Medicare Other | Attending: Cardiovascular Disease

## 2017-10-28 DIAGNOSIS — I493 Ventricular premature depolarization: Secondary | ICD-10-CM | POA: Insufficient documentation

## 2017-10-28 DIAGNOSIS — R0602 Shortness of breath: Secondary | ICD-10-CM | POA: Diagnosis present

## 2017-10-28 MED ORDER — TECHNETIUM TC 99M TETROFOSMIN IV KIT
8.4000 | PACK | Freq: Once | INTRAVENOUS | Status: AC | PRN
  Administered 2017-10-28: 8.4 via INTRAVENOUS
  Filled 2017-10-28: qty 9

## 2017-10-28 MED ORDER — TECHNETIUM TC 99M TETROFOSMIN IV KIT
29.2000 | PACK | Freq: Once | INTRAVENOUS | Status: AC | PRN
  Administered 2017-10-28: 29.2 via INTRAVENOUS
  Filled 2017-10-28: qty 30

## 2017-10-28 MED ORDER — REGADENOSON 0.4 MG/5ML IV SOLN
0.4000 mg | Freq: Once | INTRAVENOUS | Status: AC
  Administered 2017-10-28: 0.4 mg via INTRAVENOUS

## 2017-10-29 LAB — MYOCARDIAL PERFUSION IMAGING
CHL CUP NUCLEAR SDS: 5
CHL CUP NUCLEAR SRS: 7
CHL CUP RESTING HR STRESS: 68 {beats}/min
LHR: 0.26
LV dias vol: 98 mL (ref 62–150)
LV sys vol: 45 mL
NUC STRESS TID: 0.99
Peak HR: 98 {beats}/min
SSS: 10

## 2017-11-16 ENCOUNTER — Ambulatory Visit: Payer: Medicare Other | Admitting: Internal Medicine

## 2017-11-16 ENCOUNTER — Encounter: Payer: Self-pay | Admitting: Internal Medicine

## 2017-11-16 VITALS — BP 168/88 | HR 78 | Ht 72.0 in | Wt 196.4 lb

## 2017-11-16 DIAGNOSIS — I493 Ventricular premature depolarization: Secondary | ICD-10-CM | POA: Diagnosis not present

## 2017-11-16 DIAGNOSIS — R0789 Other chest pain: Secondary | ICD-10-CM | POA: Diagnosis not present

## 2017-11-16 DIAGNOSIS — I10 Essential (primary) hypertension: Secondary | ICD-10-CM | POA: Diagnosis not present

## 2017-11-16 DIAGNOSIS — R0602 Shortness of breath: Secondary | ICD-10-CM

## 2017-11-16 MED ORDER — CARVEDILOL 6.25 MG PO TABS: 6.2500 mg | ORAL_TABLET | Freq: Two times a day (BID) | ORAL | 1 refills | Status: DC

## 2017-11-16 NOTE — Patient Instructions (Signed)
Medication Instructions:  Do not use propranolol, even as needed.  Start coreg (carvedilol) 6.25 mg two times a day.  You can use Miralax, Metamucil, Fibercon as needed for constipation.  Labwork: None   Testing/Procedures: None   Follow-Up: Your physician recommends that you schedule a follow-up appointment in: 4 months with Dr End.          If you need a refill on your cardiac medications before your next appointment, please call your pharmacy.

## 2017-11-16 NOTE — Progress Notes (Signed)
Follow-up Outpatient Visit Date: 11/16/2017  Primary Care Provider: Darrin Nipperollege, [facility] @ [place] [address] [place] [id number]  Chief Complaint: Follow-up palpitations and chest pain  HPI:  [name] is a 76 y.o. year-old male with history of HTN, HLD, DM, and anxiety, who presents for follow-up of palpitations and chest pain. I last saw him on 10/19/17. At that time, he reported improvement in palpitations and dyspnea with uptitration of clonazepam by his PCP. We agreed to perform a myocardial perfusion stress test, which returned low risk (see details below).  Today, [name] reports that he feels about the same. He still notes sporadic shortness of breath and chest pain, which is not related to any particular activity. He sometimes takes propranolol when he is going out, and feels like this helps prevent some of the symptoms. He continues to have occasional "skipped beats" without associated symptoms. He has intermittent dependent leg swelling and leg cramps. His home BP has been around 160/80.   --------------------------------------------------------------------------------------------------  Cardiovascular History & Procedures: Cardiovascular Problems:  Palpitations  Chest pain  Risk Factors:  Hypertension, hyperlipidemia, diabetes mellitus, male gender, and age greater than 5655  Cath/PCI:  None  CV Surgery:  None  EP Procedures and Devices:  30-day event monitor (09/01/17): Sinus rhythm with PVCs, including ventricular trigeminy.  Patient reported symptoms correspond to PVCs.  Non-Invasive Evaluation(s):  Pharmacologic MPI (10/28/17): Low risk study with small in size, mild in severity fixed inferolateral defect that may represent scar or artifact. LVEF 53%.  TTE (09/01/17): Normal LV size with moderate focal basal hypertrophy of the septum.  LVEF 55-60% with normal regional wall motion.  Grade 1 diastolic dysfunction.   Trileaflet, mildly thickened, mildly calcified aortic valve with trivial regurgitation.  Normal RV size and function.  Recent CV Pertinent Labs: Lab Results  Component Value Date   K 5.6 (H) 04/21/2015   BUN 16 04/21/2015   CREATININE 1.26 04/21/2015    Past medical and surgical history were reviewed and updated in EPIC.  Current Meds  Medication Sig  . amLODipine (NORVASC) 10 MG tablet Take 10 mg by mouth daily.  Marland Kitchen. atorvastatin (LIPITOR) 20 MG tablet Take 20 mg by mouth once a week.   . clonazePAM (KLONOPIN) 1 MG tablet Take 1.5 mg by mouth 2 (two) times daily.   . insulin lispro protamine-lispro (HUMALOG 75/25 MIX) (75-25) 100 UNIT/ML SUSP injection Inject 20 Units into the skin 2 (two) times daily with a meal.  . losartan (COZAAR) 100 MG tablet Take 100 mg by mouth daily.   . propranolol (INDERAL) 20 MG tablet 1 TABLET TWICE A DAY AS NEEDED FOR SHAKINESS AND PALPITATIONS ORALLY 30 DAY(S)    Allergies: Lantus [insulin glargine] and Metformin and related  Social History   Socioeconomic History  . Marital status: Married    Spouse name: Not on file  . Number of children: Not on file  . Years of education: Not on file  . Highest education level: Not on file  Social Needs  . Financial resource strain: Not on file  . Food insecurity - worry: Not on file  . Food insecurity - inability: Not on file  . Transportation needs - medical: Not on file  . Transportation needs - non-medical: Not on file  Occupational History  . Not on file  Tobacco Use  . Smoking status: Never Smoker  . Smokeless tobacco: Never Used  Substance and Sexual Activity  . Alcohol use: No  . Drug  use: No  . Sexual activity: Not on file  Other Topics Concern  . Not on file  Social History Narrative  . Not on file    Family History  Problem Relation Age of Onset  . Bleeding Disorder Sister     Review of Systems: A 12-system review of systems was performed and was negative except as noted in the  HPI.  --------------------------------------------------------------------------------------------------  Physical Exam: BP (!) 168/88   Pulse 78   Ht 6' (1.829 m)   Wt 196 lb 6.4 oz (89.1 kg)   SpO2 98%   BMI 26.64 kg/m   General:  Overweight man, seated comfortably in the exam room. He is accompanied by his wife. HEENT: No conjunctival pallor or scleral icterus. Moist mucous membranes.  OP clear. Neck: Supple without lymphadenopathy, thyromegaly, JVD, or HJR. Lungs: Normal work of breathing. Clear to auscultation bilaterally without wheezes or crackles. Heart: Regular rate and rhythm with occasional extrasystoles. No murmurs, rubs, or gallops. Non-displaced PMI. Abd: Bowel sounds present. Soft, NT/ND without hepatosplenomegaly Ext: No lower extremity edema. Radial, PT, and DP pulses are 2+ bilaterally. Skin: Warm and dry without rash.  EKG:  Normal sinus rhythm with non-specific T-wave abnormality.  Lab Results  Component Value Date   WBC 4.5 04/21/2015   HGB 14.4 04/21/2015   HCT 43.5 04/21/2015   MCV 69.3 (L) 04/21/2015   PLT 183 04/21/2015    Lab Results  Component Value Date   NA 132 (L) 04/21/2015   K 5.6 (H) 04/21/2015   CL 101 04/21/2015   CO2 26 04/21/2015   BUN 16 04/21/2015   CREATININE 1.26 04/21/2015   GLUCOSE 239 (H) 04/21/2015   ALT 12 06/09/2010    No results found for: CHOL, HDL, LDLCALC, LDLDIRECT, TRIG, CHOLHDL  --------------------------------------------------------------------------------------------------  ASSESSMENT AND PLAN: Chest pain and shortness of breath Symptoms are sporadic and stable. Given some improvement with intermittent propranolol use, we have agreed to start standing carvedilol 6.25 mg BID. 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 We will defer catheterization for now unless symptoms worsen.  PVCs Rare palpitations noted since last visit. We will start standing carvedilol, as  above.  Hypertension Blood pressure poorly controlled today and similar to reported home readings. We will add carvedilol 6.25 mg BID and continue with amlodipine and losartan. If BP remains elevated, addition of a thiazide diuretic will be considered.  Follow-up: Return to clinic in 4 months.  Yvonne Kendallhristopher Kaiven Vester, MD 11/16/2017 12:15 PM

## 2017-11-17 ENCOUNTER — Encounter: Payer: Self-pay | Admitting: Internal Medicine

## 2018-03-18 ENCOUNTER — Ambulatory Visit: Payer: Medicare Other | Admitting: Internal Medicine

## 2018-04-16 ENCOUNTER — Ambulatory Visit: Payer: Medicare Other | Admitting: Internal Medicine

## 2018-04-16 ENCOUNTER — Encounter: Payer: Self-pay | Admitting: Internal Medicine

## 2018-04-16 VITALS — BP 170/98 | HR 88 | Ht 72.0 in | Wt 198.2 lb

## 2018-04-16 DIAGNOSIS — E785 Hyperlipidemia, unspecified: Secondary | ICD-10-CM | POA: Diagnosis not present

## 2018-04-16 DIAGNOSIS — R002 Palpitations: Secondary | ICD-10-CM | POA: Diagnosis not present

## 2018-04-16 DIAGNOSIS — I2510 Atherosclerotic heart disease of native coronary artery without angina pectoris: Secondary | ICD-10-CM

## 2018-04-16 DIAGNOSIS — I10 Essential (primary) hypertension: Secondary | ICD-10-CM | POA: Diagnosis not present

## 2018-04-16 LAB — COMPREHENSIVE METABOLIC PANEL
A/G RATIO: 1.1 — AB (ref 1.2–2.2)
ALT: 21 IU/L (ref 0–44)
AST: 27 IU/L (ref 0–40)
Albumin: 3.8 g/dL (ref 3.5–4.8)
Alkaline Phosphatase: 75 IU/L (ref 39–117)
BILIRUBIN TOTAL: 1 mg/dL (ref 0.0–1.2)
BUN/Creatinine Ratio: 11 (ref 10–24)
BUN: 14 mg/dL (ref 8–27)
CHLORIDE: 98 mmol/L (ref 96–106)
CO2: 24 mmol/L (ref 20–29)
Calcium: 9.3 mg/dL (ref 8.6–10.2)
Creatinine, Ser: 1.29 mg/dL — ABNORMAL HIGH (ref 0.76–1.27)
GFR calc non Af Amer: 53 mL/min/{1.73_m2} — ABNORMAL LOW (ref >59)
GFR, EST AFRICAN AMERICAN: 61 mL/min/{1.73_m2} (ref >59)
GLOBULIN, TOTAL: 3.4 g/dL (ref 1.5–4.5)
Glucose: 235 mg/dL — ABNORMAL HIGH (ref 65–99)
POTASSIUM: 4.3 mmol/L (ref 3.5–5.2)
SODIUM: 136 mmol/L (ref 134–144)
Total Protein: 7.2 g/dL (ref 6.0–8.5)

## 2018-04-16 LAB — MAGNESIUM: Magnesium: 1.6 mg/dL (ref 1.6–2.3)

## 2018-04-16 MED ORDER — ROSUVASTATIN CALCIUM 5 MG PO TABS: 5.0000 mg | ORAL_TABLET | Freq: Every day | ORAL | 3 refills | Status: DC

## 2018-04-16 MED ORDER — METOPROLOL TARTRATE 100 MG PO TABS: ORAL_TABLET | ORAL | 3 refills | Status: DC

## 2018-04-16 NOTE — Patient Instructions (Addendum)
Medication Instructions:  START Metoprolol Tartrate 100mg  twice per day START Rosuvastatin 5mg  daily START Aspirin 81 mg daily  -- If you need a refill on your cardiac medications before your next appointment, please call your pharmacy. --  Labwork: CMET/MAGNESIUM TODAY  FASTING LIPID/ALT 6 WEEKS  Testing/Procedures: 6 WEEKS HTN CLINIC   Follow-Up: Your physician wants you to follow-up in: 3 MONTHS with Dr. Okey DupreEnd.      Thank you for choosing [facility]!!    Any Other Special Instructions Will Be Listed Below (If Applicable).

## 2018-04-16 NOTE — Progress Notes (Signed)
Follow-up Outpatient Visit Date: 04/16/2018  Primary [facility] Provider: [facility] @ [address] [place] [address]  Chief Complaint: Follow-up palpitations and chest pain  HPI:  Ronald Ramirez is a 77 y.o. year-old male with history of hypertension, hyperlipidemia, diabetes mellitus, and anxiety, who presents for follow-up of palpitations and chest pain.  I last saw him in November, at which time he continued to have sporadic shortness of breath and chest pain, as well as occasional "skipped beats."  He takes propranolol intermittently with improvement.  We therefore agreed to initiate standing carvedilol 6.25 mg twice daily.  Stress test earlier in November was low risk with a small area of fixed inferolateral hypoperfusion suggestive of scar versus artifact.  Today, Ronald Ramirez reports that he is actually been feeling very well.  He has not had any further chest pain or palpitations since our last visit.  He only took 1 dose of the carvedilol that I prescribed for him after our last visit because it "made me feel hot."  He has also stopped taking atorvastatin because of the same hot feeling.  However, he was started on metoprolol tartrate 50 mg twice daily by his PCP, which Ronald Ramirez is tolerating well.  His only complaint is of intermittent aching along the left shin, predominantly at night.  He is using ibuprofen with pain relief.  He denies shortness of breath, edema, orthopnea, and lightheadedness.  He has experienced occasional indigestion.  --------------------------------------------------------------------------------------------------  Cardiovascular History & Procedures: Cardiovascular Problems:  Palpitations  Chest pain  Risk Factors:  Hypertension, hyperlipidemia, diabetes mellitus, male gender, and age greater than 52  Cath/PCI:  None  CV Surgery:  None  EP Procedures and Devices:  30-day event monitor (09/01/17): Sinus  rhythm with PVCs, including ventricular trigeminy. Patient reported symptoms correspond to PVCs.  Non-Invasive Evaluation(s):  Pharmacologic MPI (10/28/17): Low risk study with small in size, mild in severity fixed inferolateral defect that may represent scar or artifact. LVEF 53%.  TTE (09/01/17): Normal LV size with moderate focal basal hypertrophy of the septum. LVEF 55-60% with normal regional wall motion. Grade 1 diastolic dysfunction. Trileaflet, mildly thickened, mildly calcified aortic valve with trivial regurgitation. Normal RV size and function.  Recent CV Pertinent Labs: Lab Results  Component Value Date   K 5.6 (H) 04/21/2015   BUN 16 04/21/2015   CREATININE 1.26 04/21/2015    Past medical and surgical history were reviewed and updated in EPIC.  Current Meds  Medication Sig  . amLODipine (NORVASC) 10 MG tablet Take 10 mg by mouth daily.  Marland Kitchen atorvastatin (LIPITOR) 20 MG tablet Take 20 mg by mouth once a week.   . clonazePAM (KLONOPIN) 1 MG tablet Take 1.5 mg by mouth 2 (two) times daily.   . insulin lispro protamine-lispro (HUMALOG 75/25 MIX) (75-25) 100 UNIT/ML SUSP injection Inject 20 Units into the skin 2 (two) times daily with a meal.  . losartan (COZAAR) 100 MG tablet Take 100 mg by mouth daily.   . metoprolol tartrate (LOPRESSOR) 50 MG tablet Take 50 mg by mouth 2 (two) times daily with a meal.  . [DISCONTINUED] carvedilol (COREG) 6.25 MG tablet Take 1 tablet (6.25 mg total) by mouth 2 (two) times daily.    Allergies: Lantus [insulin glargine] and Metformin and related  Social History   Tobacco Use  . Smoking status: Never Smoker  . Smokeless tobacco: Never Used  Substance Use Topics  . Alcohol use: No  . Drug use: No  Family History  Problem Relation Age of Onset  . Bleeding Disorder Sister     Review of Systems: A 12-system review of systems was performed and was negative except as noted in the  HPI.  --------------------------------------------------------------------------------------------------  Physical Exam: BP (!) 170/98   Pulse 88   Ht 6' (1.829 m)   Wt 198 lb 3.2 oz (89.9 kg)   BMI 26.88 kg/m   General: NAD. HEENT: No conjunctival pallor or scleral icterus. Moist mucous membranes.  OP clear. Neck: Supple without lymphadenopathy, thyromegaly, JVD, or HJR. Lungs: Normal work of breathing. Clear to auscultation bilaterally without wheezes or crackles. Heart: Regular rate and rhythm without murmurs, rubs, or gallops. Non-displaced PMI. Abd: Bowel sounds present. Soft, NT/ND without hepatosplenomegaly Ext: No lower extremity edema. Radial, PT, and DP pulses are 2+ bilaterally. Skin: Warm and dry without rash.  EKG:  NSR with lateral T-wave inversions, more pronounced than on prior tracing from 11/16/17.  Lab Results  Component Value Date   WBC 4.5 04/21/2015   HGB 14.4 04/21/2015   HCT 43.5 04/21/2015   MCV 69.3 (L) 04/21/2015   PLT 183 04/21/2015    Lab Results  Component Value Date   NA 132 (L) 04/21/2015   K 5.6 (H) 04/21/2015   CL 101 04/21/2015   CO2 26 04/21/2015   BUN 16 04/21/2015   CREATININE 1.26 04/21/2015   GLUCOSE 239 (H) 04/21/2015   ALT 12 06/09/2010    No results found for: CHOL, HDL, LDLCALC, LDLDIRECT, TRIG, CHOLHDL  --------------------------------------------------------------------------------------------------  ASSESSMENT AND PLAN: Hypertension Blood pressure poorly controlled today.  We discussed the importance of medication compliance and sodium restriction.  We have agreed to increase metoprolol tartrate to 100 mg twice daily, though I suspect his blood pressure will still not be adequately controlled.  I would favor adding a thiazide diuretic as our next agent, with continuation of current doses of amlodipine and losartan.  I will check a complete metabolic panel today in anticipation of likely needing to add HCTZ when Mr.  Ronald Ramirez follows up.  We will have him return to see the hypertension clinic in 6 weeks.  Hyperlipidemia Most recent LDL in 06/2017 was 62.  Given history of chest pain and abnormal stress test with evidence of some inferolateral scar, I think it would be prudent to continue with statin therapy for a goal LDL of less than 70.  Given that Ronald Ramirez stopped atorvastatin due to intolerance, we have agreed to try rosuvastatin 5 mg daily.  He will have a fasting lipid panel and ALT drawn when he returns in 6 weeks.  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  Prior myocardial perfusion stress test was low risk with possible inferolateral scar.  EKG today shows more pronounced inferolateral T wave inversions.  However, given lack of symptoms, we have agreed to defer additional testing and instead focus on medical therapy.  Palpitations No further symptoms reported.  No further work-up at this time.  We will increase metoprolol, as above.  Follow-up: Return to see the hypertension clinic in 6 weeks.  Follow-up with me in 3 months.  Yvonne Kendallhristopher Magdalina Whitehead, MD 04/16/2018 9:53 AM

## 2018-04-19 ENCOUNTER — Other Ambulatory Visit: Payer: Self-pay

## 2018-04-19 MED ORDER — MAGNESIUM OXIDE 400 MG PO TABS: 400.0000 mg | ORAL_TABLET | Freq: Every day | ORAL | 3 refills | Status: DC

## 2018-06-01 ENCOUNTER — Encounter: Payer: Self-pay | Admitting: Pharmacist

## 2018-06-01 ENCOUNTER — Other Ambulatory Visit: Payer: Medicare Other

## 2018-06-01 ENCOUNTER — Ambulatory Visit (INDEPENDENT_AMBULATORY_CARE_PROVIDER_SITE_OTHER): Payer: Medicare Other | Admitting: Pharmacist

## 2018-06-01 VITALS — BP 162/100 | HR 59

## 2018-06-01 DIAGNOSIS — R002 Palpitations: Secondary | ICD-10-CM

## 2018-06-01 DIAGNOSIS — I10 Essential (primary) hypertension: Secondary | ICD-10-CM | POA: Diagnosis not present

## 2018-06-01 LAB — ALT: ALT: 23 IU/L (ref 0–44)

## 2018-06-01 LAB — LIPID PANEL
CHOL/HDL RATIO: 2.5 ratio (ref 0.0–5.0)
Cholesterol, Total: 96 mg/dL — ABNORMAL LOW (ref 100–199)
HDL: 38 mg/dL — AB (ref >39)
LDL Calculated: 38 mg/dL (ref 0–99)
Triglycerides: 101 mg/dL (ref 0–149)
VLDL Cholesterol Cal: 20 mg/dL (ref 5–40)

## 2018-06-01 MED ORDER — LOSARTAN POTASSIUM-HCTZ 100-25 MG PO TABS: 1.0000 | ORAL_TABLET | Freq: Every day | ORAL | 1 refills | Status: DC

## 2018-06-01 NOTE — Patient Instructions (Addendum)
START losartan/HCTZ 100/25mg  ONCE daily  STOP losartan   Monitor your blood pressure and heart rate - bring your log to follow up. If your heart rate is below 60 call our office.

## 2018-06-01 NOTE — Progress Notes (Signed)
Patient ID: Ronald SaverHarlem Curtiss                 DOB: 05/10/1941                      MRN: [id number]     HPI: Ronald Ramirez is a 77 y.o. male patient of Dr. Okey DupreEnd who presents today for hypertension evaluation. PMH significant for hypertension, hyperlipidemia, diabetes mellitus, and anxiety. At his most recent OV his metoprolol was increased to 100mg  BID. It was discussed to add HCTZ if pressures remain uncontrolled.   He presents today for BP management with his daughter. He states that his bottom number will sometimes get in 110s. Bag of medication without losartan and amlodipine. He states that he is still taking losartan, but that he is unsure of amlodipine. He believes he may have stopped this several months ago as someone told him it could be related to swelling. When asked if he had issues with swelling he does not seem to have had any swelling. He denies chest pain and dizziness. He does state that he has had a few 'moments' recently where he felt poorly and that this lasted only a few moments - he initially described this as a 'stroke'. He states he started feeling better after he ate a sandwich. Advised that if he truly associates this with stroke he needs to call 911 to be evaluated.   Current HTN meds:  Amlodipine 10mg  daily - not taking since several months ago Losartan 100mg  daily in the morning Metoprolol tartrate 100mg  BID (1am and 1 pm)  Previously tried: amlodipine - he stopped due to swelling (people told him could cause swelling)  BP goal: <130/80  Family History:   Social History: dneies tobacco and alcohol  Diet: He eats from home and out. He does not add salt to food. He drinks mostly [place] dry ginger ale.   Exercise: He walks some at home. He works on cars. He takes care of his own yard.   Home BP readings: He has several cuffs at home, but does not recall measurements other than that they are high  Wt Readings from Last 3 Encounters:  04/16/18 198 lb 3.2 oz (89.9  kg)  11/16/17 196 lb 6.4 oz (89.1 kg)  10/28/17 197 lb (89.4 kg)   BP Readings from Last 3 Encounters:  06/01/18 (!) 162/100  04/16/18 (!) 170/98  11/16/17 (!) 168/88   Pulse Readings from Last 3 Encounters:  06/01/18 (!) 59  04/16/18 88  11/16/17 78    Renal function: CrCl cannot be calculated (Patient's most recent lab result is older than the maximum 21 days allowed.).  Past Medical History:  Diagnosis Date  . Anxiety   . Chest pain    NUC stress 11/09- overall low risk, no ishemia, normal EF, exercise time 5:01, NSSSTW changes  . Diabetes mellitus without complication (HCC)   . Hypertension   . Occasional tremors   . PVC's (premature ventricular contractions)     Current Outpatient Medications on File Prior to Visit  Medication Sig Dispense Refill  . acetaminophen (TYLENOL) 650 MG CR tablet Take 650 mg by mouth every 8 (eight) hours as needed for pain.    . clonazePAM (KLONOPIN) 1 MG tablet Take 1.5 mg by mouth 2 (two) times daily.   0  . insulin lispro protamine-lispro (HUMALOG 75/25 MIX) (75-25) 100 UNIT/ML SUSP injection Inject 20 Units into the skin 2 (two) times daily with a meal.    .  magnesium oxide (MAG-OX) 400 MG tablet Take 1 tablet (400 mg total) by mouth daily. 90 tablet 3  . metoprolol tartrate (LOPRESSOR) 100 MG tablet Take 1 tablet (100mg ) twice a day 180 tablet 3  . rosuvastatin (CRESTOR) 5 MG tablet Take 1 tablet (5 mg total) by mouth daily. 90 tablet 3  . amLODipine (NORVASC) 10 MG tablet Take 10 mg by mouth daily.  3   No current facility-administered medications on file prior to visit.     Allergies  Allergen Reactions  . Lantus [Insulin Glargine] Itching  . Metformin And Related Diarrhea    Blood pressure (!) 162/100, pulse (!) 59.   Assessment/Plan: Hypertension: BP today elevated. Unclear when/why/if amlodipine was stopped. I will leave this on his medication list for now and try to call the pharmacy to verify if he has actually been  taking this medication. If he has not been taking will plan to keep him off this for now and will add HCTZ to his regimen. Will start combination losartan 100/HCTZ 25mg  daily to help with compliance. Will plan to repeat BMET in 4 weeks when he returns for follow up with Dr. Okey Dupre. Have asked he bring log of pressures/HR to follow up visit. He can follow up in HTN if needed for additional medication/BP management.    Thank you, Freddie Apley. Cleatis Polka, PharmD  [facility]  06/01/2018 1:38 PM  Called pharmacy and confirmed that last picked up amlodipine on 04/07/18 for 90 day supply. Based on this I believe the patient may be taking the medication and will continue this in addition to the above.

## 2018-07-12 ENCOUNTER — Other Ambulatory Visit: Payer: Medicare Other

## 2018-07-12 ENCOUNTER — Ambulatory Visit: Payer: Medicare Other | Admitting: Internal Medicine

## 2018-08-26 ENCOUNTER — Other Ambulatory Visit: Payer: Self-pay | Admitting: Internal Medicine

## 2018-08-26 NOTE — Telephone Encounter (Signed)
Please review for refill, Thanks !  

## 2018-11-05 ENCOUNTER — Other Ambulatory Visit: Payer: Self-pay

## 2018-11-05 MED ORDER — LOSARTAN POTASSIUM-HCTZ 100-25 MG PO TABS: 1.0000 | ORAL_TABLET | Freq: Every day | ORAL | 0 refills | Status: DC

## 2018-11-08 ENCOUNTER — Telehealth: Payer: Self-pay | Admitting: Internal Medicine

## 2018-11-08 MED ORDER — LOSARTAN POTASSIUM 100 MG PO TABS: 100.0000 mg | ORAL_TABLET | Freq: Every day | ORAL | 1 refills | Status: DC

## 2018-11-08 MED ORDER — HYDROCHLOROTHIAZIDE 25 MG PO TABS: 25.0000 mg | ORAL_TABLET | Freq: Every day | ORAL | 1 refills | Status: DC

## 2018-11-08 NOTE — Telephone Encounter (Signed)
Pt's medications were sent to pt's pharmacy as requested. Confirmation received.  

## 2018-11-08 NOTE — Telephone Encounter (Signed)
[facility] losartan/HCTZ 100-25 mg tablets are on backorder and requesting new Rx be sent in for 2 separate prescriptions, so pt can get his medications. Please address

## 2018-11-08 NOTE — Telephone Encounter (Signed)
Okay.  It is fine to send separate prescriptions for losartan 100 mg daily and HCTZ 25 mg daily.  Thanks.  Yvonne Kendallhristopher Breigh Annett, MD [facility] Pager: [phone]

## 2019-01-27 ENCOUNTER — Ambulatory Visit
Admission: RE | Admit: 2019-01-27 | Discharge: 2019-01-27 | Disposition: A | Discharge status: Home / Self Care | Payer: Medicare Other | Source: Ambulatory Visit | Attending: Family Medicine | Admitting: Family Medicine

## 2019-01-27 ENCOUNTER — Other Ambulatory Visit: Payer: Self-pay | Admitting: Family Medicine

## 2019-01-27 DIAGNOSIS — R109 Unspecified abdominal pain: Secondary | ICD-10-CM

## 2019-02-22 ENCOUNTER — Other Ambulatory Visit: Payer: Self-pay

## 2019-02-22 MED ORDER — HYDROCHLOROTHIAZIDE 25 MG PO TABS: 25.0000 mg | ORAL_TABLET | Freq: Every day | ORAL | 0 refills | Status: DC

## 2019-05-11 ENCOUNTER — Other Ambulatory Visit: Payer: Self-pay | Admitting: *Deleted

## 2019-05-11 MED ORDER — ROSUVASTATIN CALCIUM 5 MG PO TABS: 5.0000 mg | ORAL_TABLET | Freq: Every day | ORAL | 0 refills | Status: DC

## 2019-05-11 NOTE — Telephone Encounter (Signed)
Received request for rosuvastatin refill.  Patient overdue for follow up. First attempt. 30 days refill sent to pharmacy with note to call to schedule appointment for further refills.

## 2019-05-20 ENCOUNTER — Other Ambulatory Visit: Payer: Self-pay | Admitting: Internal Medicine

## 2019-05-20 ENCOUNTER — Telehealth: Payer: Self-pay | Admitting: Internal Medicine

## 2019-05-20 MED ORDER — MAGNESIUM OXIDE 400 MG PO TABS: 400.0000 mg | ORAL_TABLET | Freq: Every day | ORAL | 0 refills | Status: DC

## 2019-05-20 NOTE — Telephone Encounter (Signed)
Pt's medication was sent to pt's pharmacy as requested. Confirmation received.  °

## 2019-05-20 NOTE — Telephone Encounter (Signed)
Medication has already been sent to pt's pharmacy as requested. Confirmation received.  °

## 2019-05-20 NOTE — Telephone Encounter (Signed)
New Message            *STAT* If patient is at the pharmacy, call can be transferred to refill team.   1. Which medications need to be refilled? (please list name of each medication and dose if known) Magnesium 400 mg  2. Which pharmacy/location (including street and city if local pharmacy) is [facility] [place]  3. Do they need a 30 day or 90 day supply? 1   Spoke with patient he is aware he needs an appointment, nothing is available at this moment but will call back.

## 2019-06-02 ENCOUNTER — Other Ambulatory Visit: Payer: Self-pay | Admitting: Internal Medicine

## 2019-06-02 NOTE — Telephone Encounter (Signed)
Refill Request.  

## 2019-06-07 NOTE — Progress Notes (Signed)
Follow-up Outpatient Visit Date: 06/08/2019  Primary Care Provider: Darrin Nipperollege, [facility] @ [address] [address] [place] [id number]  Chief Complaint: Follow-up hypertension  HPI:  Ronald Ramirez is a 78 y.o. year-old male with history of hypertension, hyperlipidemia, diabetes mellitus, and anxiety, who presents for follow-up of hypertension.  I last saw him in 03/2018, which time Ronald Ramirez reported that he was feeling very well.  He noted that he had not been taking the previously prescribed carvedilol, as it made him feel hot.  He was subsequently started on metoprolol tartrate by his PCP, which he was tolerating well.  Due to suboptimal blood pressure control, we agreed to increase metoprolol tartrate to 100 mg twice daily.  He was subsequently seen in the hypertension clinic in 05/2018.  HCTZ was added to his regimen.  Today, Ronald Ramirez reports feeling relatively well.  He has noted a couple of episodes of chest pain that he describes as "gas" in the center of chest.  The pain is not exertional and comes on randomly.  It typically resolves on its own after 3 minutes or less.  There are no associated symptoms.  Ronald Ramirez has also notes some night sweat (typically involving his low back).  He denies palpitations, lightheadedness/dizziness, orthopnea, PND, and edema.  Ronald Ramirez took only one dose of rosuvastatin, as he felt somewhat dizzy after taking it.  However, he would like to try taking it again at bedtime.  He otherwise has been compliant with his medications without adverse effects.  He notes that his home blood pressure readings have been similar to today's measurement in the office.  He had labs done with his PCP last fall.  --------------------------------------------------------------------------------------------------  Cardiovascular History & Procedures: Cardiovascular Problems:  Palpitations  Chest pain  Risk Factors:  Hypertension,  hyperlipidemia, diabetes mellitus, male gender, and age greater than 2855  Cath/PCI:  None  CV Surgery:  None  EP Procedures and Devices:  30-day event monitor (09/01/17): Sinus rhythm with PVCs, including ventricular trigeminy. Patient reported symptoms correspond to PVCs.  Non-Invasive Evaluation(s):  Pharmacologic MPI (10/28/17): Low risk study with small in size, mild in severity fixed inferolateral defect that may represent scar or artifact. LVEF 53%.  TTE (09/01/17): Normal LV size with moderate focal basal hypertrophy of the septum. LVEF 55-60% with normal regional wall motion. Grade 1 diastolic dysfunction. Trileaflet, mildly thickened, mildly calcified aortic valve with trivial regurgitation. Normal RV size and function.  Recent CV Pertinent Labs: Lab Results  Component Value Date   CHOL 96 (L) 06/01/2018   HDL 38 (L) 06/01/2018   LDLCALC 38 06/01/2018   TRIG 101 06/01/2018   CHOLHDL 2.5 06/01/2018   K 4.3 04/16/2018   MG 1.6 04/16/2018   BUN 14 04/16/2018   CREATININE 1.29 (H) 04/16/2018    Past medical and surgical history were reviewed and updated in EPIC.  Current Meds  Medication Sig  . amLODipine (NORVASC) 10 MG tablet Take 10 mg by mouth daily.  . clonazePAM (KLONOPIN) 1 MG tablet Take 1.5 mg by mouth 2 (two) times daily.   . hydrochlorothiazide (HYDRODIURIL) 25 MG tablet Take 1 tablet (25 mg total) by mouth daily.  . insulin lispro protamine-lispro (HUMALOG 75/25 MIX) (75-25) 100 UNIT/ML SUSP injection Inject 20 Units into the skin 2 (two) times daily with a meal.  . losartan (COZAAR) 100 MG tablet Take 1 tablet (100 mg total) by mouth daily.  . magnesium oxide (MAG-OX) 400 MG tablet Take 1 tablet (  400 mg total) by mouth daily. Please make overdue appt with Dr. Saunders Revel before anymore refills. 1st attempt  . rosuvastatin (CRESTOR) 5 MG tablet Take 1 tablet (5 mg total) by mouth daily.    Allergies: Lantus [insulin glargine] and Metformin and related   Social History   Tobacco Use  . Smoking status: Never Smoker  . Smokeless tobacco: Never Used  Substance Use Topics  . Alcohol use: No  . Drug use: No    Family History  Problem Relation Age of Onset  . Bleeding Disorder Sister     Review of Systems: A 12-system review of systems was performed and was negative except as noted in the HPI.  --------------------------------------------------------------------------------------------------  Physical Exam: BP 128/78 (BP Location: Left Arm, Patient Position: Sitting, Cuff Size: Normal)   Pulse 71   Ht 6' (1.829 m)   Wt 188 lb 8 oz (85.5 kg)   BMI 25.57 kg/m   General:  NAD HEENT: No conjunctival pallor or scleral icterus. Moist mucous membranes.  OP clear. Neck: Supple without lymphadenopathy, thyromegaly, JVD, or HJR. No carotid bruit. Lungs: Normal work of breathing. Clear to auscultation bilaterally without wheezes or crackles. Heart: Regular rate and rhythm without murmurs, rubs, or gallops. Non-displaced PMI. Abd: Bowel sounds present. Soft, NT/ND without hepatosplenomegaly Ext: No lower extremity edema. Radial, PT, and DP pulses are 2+ bilaterally. Skin: Warm and dry without rash.  EKG:  NSR with PVC and non-specific T-wave changes.  Compared with prior tracing from 04/16/2018, lateral T-wave inversions are less pronounced today.  Lab Results  Component Value Date   WBC 4.5 04/21/2015   HGB 14.4 04/21/2015   HCT 43.5 04/21/2015   MCV 69.3 (L) 04/21/2015   PLT 183 04/21/2015    Lab Results  Component Value Date   NA 136 04/16/2018   K 4.3 04/16/2018   CL 98 04/16/2018   CO2 24 04/16/2018   BUN 14 04/16/2018   CREATININE 1.29 (H) 04/16/2018   GLUCOSE 235 (H) 04/16/2018   ALT 23 06/01/2018    Lab Results  Component Value Date   CHOL 96 (L) 06/01/2018   HDL 38 (L) 06/01/2018   LDLCALC 38 06/01/2018   TRIG 101 06/01/2018   CHOLHDL 2.5 06/01/2018     --------------------------------------------------------------------------------------------------  ASSESSMENT AND PLAN: Hypertension: Blood pressure is well-controlled today.  Labs, including creatinine, sodium, and potassium obtained last fall by Dr. Kenton Kingfisher were normal.  No medication changes today.  Low sodium diet was encouraged.  Atypical chest pain: Pain is gas-like and not exertional.  I have recommended taking famotidine 20 mg BID as needed for discomfort, incase Ronald Ramirez is experiencing GERD/indigestion.  Unless symptoms worsen, no plan for repeat imaging; prior stress test 10/2017 was low risk with small inferolateral defect that could represent scar or artifact.  No ischemia was identified.  Hyperlipidemia: I encouraged Ronald Ramirez to retry rosuvastatin; taking it at bedtime might help alleviate some of the dizziness he experienced after he last took it.  We will continue to target an LDL < 70.  Follow-up: Return to clinic in 6 months.  Nelva Bush, MD 06/08/2019 11:24 AM

## 2019-06-08 ENCOUNTER — Other Ambulatory Visit: Payer: Self-pay

## 2019-06-08 ENCOUNTER — Encounter: Payer: Self-pay | Admitting: Internal Medicine

## 2019-06-08 ENCOUNTER — Ambulatory Visit (INDEPENDENT_AMBULATORY_CARE_PROVIDER_SITE_OTHER): Payer: Medicare Other | Admitting: Internal Medicine

## 2019-06-08 ENCOUNTER — Telehealth: Payer: Self-pay | Admitting: *Deleted

## 2019-06-08 VITALS — BP 128/78 | HR 71 | Ht 72.0 in | Wt 188.5 lb

## 2019-06-08 DIAGNOSIS — E785 Hyperlipidemia, unspecified: Secondary | ICD-10-CM | POA: Diagnosis not present

## 2019-06-08 DIAGNOSIS — R0789 Other chest pain: Secondary | ICD-10-CM | POA: Diagnosis not present

## 2019-06-08 DIAGNOSIS — I10 Essential (primary) hypertension: Secondary | ICD-10-CM

## 2019-06-08 NOTE — Telephone Encounter (Addendum)

## 2019-06-08 NOTE — Patient Instructions (Signed)
Medication Instructions:  Your physician has recommended you make the following change in your medication:  1- FOR HEARTBURN, TRY Famotidine 20 mg by mouth 2 times a day. You can get this over-the-counter.   If you need a refill on your cardiac medications before your next appointment, please call your pharmacy.   Lab work: Labwork will be requested from your primary care physician.  If you have labs (blood work) drawn today and your tests are completely normal, you will receive your results only by: Marland Kitchen MyChart Message (if you have MyChart) OR . A paper copy in the mail If you have any lab test that is abnormal or we need to change your treatment, we will call you to review the results.  Testing/Procedures: - None ordered.   Follow-Up: At [facility], you and your health needs are our priority.  As part of our continuing mission to provide you with exceptional heart care, we have created designated Provider Care Teams.  These Care Teams include your primary Cardiologist (physician) and Advanced Practice Providers (APPs -  Physician Assistants and Nurse Practitioners) who all work together to provide you with the care you need, when you need it. You will need a follow up appointment in 6 months.  Please call our office 2 months in advance to schedule this appointment.  You may see Nelva Bush, MD or one of the following Advanced Practice Providers on your designated Care Team:   Murray Hodgkins, NP Christell Faith, PA-C . Marrianne Mood, PA-C

## 2019-06-16 ENCOUNTER — Telehealth: Payer: Self-pay | Admitting: Internal Medicine

## 2019-06-16 ENCOUNTER — Other Ambulatory Visit: Payer: Self-pay

## 2019-06-16 MED ORDER — MAGNESIUM OXIDE 400 MG PO TABS: 400.0000 mg | ORAL_TABLET | Freq: Every day | ORAL | 1 refills | Status: DC

## 2019-06-16 NOTE — Telephone Encounter (Signed)
°*  STAT* If patient is at the pharmacy, call can be transferred to refill team.   1. Which medications need to be refilled? (please list name of each medication and dose if known) Mag-Ox 400 mg 1 tablet daily  2. Which pharmacy/location (including street and city if local pharmacy) [facility]  [address], [place], [place] [address]   3. Do they need a 30 day or 90 day supply? 90 day

## 2019-06-24 ENCOUNTER — Other Ambulatory Visit: Payer: Self-pay | Admitting: Internal Medicine

## 2019-07-11 ENCOUNTER — Telehealth: Payer: Self-pay

## 2019-07-11 MED ORDER — ROSUVASTATIN CALCIUM 5 MG PO TABS: 5.0000 mg | ORAL_TABLET | Freq: Every day | ORAL | 0 refills | Status: DC

## 2019-07-11 NOTE — Telephone Encounter (Signed)
Requested Prescriptions   Signed Prescriptions Disp Refills  . rosuvastatin (CRESTOR) 5 MG tablet 90 tablet 0    Sig: Take 1 tablet (5 mg total) by mouth daily.    Authorizing Provider: END, CHRISTOPHER    Ordering User: NEWCOMER MCCLAIN, Keenon Leitzel L    

## 2019-07-26 ENCOUNTER — Other Ambulatory Visit: Payer: Self-pay

## 2019-07-26 MED ORDER — HYDROCHLOROTHIAZIDE 25 MG PO TABS: 25.0000 mg | ORAL_TABLET | Freq: Every day | ORAL | 3 refills | Status: DC

## 2019-07-26 NOTE — Telephone Encounter (Signed)
Refill sent for HCTZ 25 mg  

## 2019-08-10 ENCOUNTER — Telehealth: Payer: Self-pay

## 2019-08-10 MED ORDER — LOSARTAN POTASSIUM 100 MG PO TABS: 100.0000 mg | ORAL_TABLET | Freq: Every day | ORAL | 1 refills | Status: DC

## 2019-08-10 NOTE — Telephone Encounter (Signed)
[facility]HCTZ [facility]

## 2019-08-15 ENCOUNTER — Other Ambulatory Visit: Payer: Self-pay | Admitting: Internal Medicine

## 2019-10-25 ENCOUNTER — Other Ambulatory Visit: Payer: Self-pay | Admitting: Internal Medicine

## 2020-01-23 NOTE — Progress Notes (Deleted)
Cardiology Office Note    Date:  01/23/2020   ID:  [place], DOB [birth year], MRN [id number]  PCP:  [place] [place] [facility] [place]  Cardiologist:  Nelva Bush, MD  Electrophysiologist:  None   Chief Complaint: Follow-up  History of Present Illness:   Ronald Ramirez is a 79 y.o. male with history of DM2, HTN, HLD, and anxiety who presents for follow-up of hypertension.  Prior echo from 08/2017 showed an EF of 55 to 60%, moderate focal basal hypertrophy of the septum, no regional wall motion abnormalities, grade 1 diastolic dysfunction trileaflet aortic valve that was mildly thickened and calcified with trivial regurgitation, normal RV systolic function and cavity size.  30-day event monitor from 08/2017 showed sinus rhythm with PVCs, including ventricular trigeminy.  Patient reported symptoms corresponded to PVCs.  Lexiscan Myoview from 10/2017 was a low risk study with a small in size, mild in severity fixed inferior lateral defect possibly representing scar versus artifact.  LVEF 53%.  Patient has previously been prescribed carvedilol for hypertension though did not tolerate this as it made him "feel hot."  He was subsequently started on metoprolol without issue.  He has been seen in the hypertension clinic.  He was last seen in the office in 05/2019 and was feeling well.  He noted a couple episodes of chest pain that he described as "gas."  His pain was felt to be overall atypical and he was recommended to take famotidine.  BP was well controlled at 128/78.  He did note some dizziness after taking 1 dose of rosuvastatin.  ***   Labs independently reviewed: 05/2018 - ALT normal, TC 96, TG 101, HDL 38, LDL 38, magnesium 1.6, BUN 14, serum creatinine 1.29, potassium 4.3, albumin 3.8, AST/ALT normal  Past Medical History:  Diagnosis Date  . Anxiety   . Chest pain    NUC stress 11/09- overall low risk, no ishemia, normal EF, exercise time 5:01, NSSSTW changes  .  Diabetes mellitus without complication ([place])   . Hypertension   . Occasional tremors   . PVC's (premature ventricular contractions)     Past Surgical History:  Procedure Laterality Date  . HERNIA REPAIR      Current Medications: No outpatient medications have been marked as taking for the 01/25/20 encounter (Appointment) with Rise Mu, PA-C.    Allergies:   Lantus [insulin glargine] and Metformin and related   Social History   Socioeconomic History  . Marital status: Married    Spouse name: Not on file  . Number of children: Not on file  . Years of education: Not on file  . Highest education level: Not on file  Occupational History  . Not on file  Tobacco Use  . Smoking status: Never Smoker  . Smokeless tobacco: Never Used  Substance and Sexual Activity  . Alcohol use: No  . Drug use: No  . Sexual activity: Not on file  Other Topics Concern  . Not on file  Social History Narrative  . Not on file   Social Determinants of Health   Financial Resource Strain:   . Difficulty of Paying Living Expenses: Not on file  Food Insecurity:   . Worried About Charity fundraiser in the Last Year: Not on file  . Ran Out of Food in the Last Year: Not on file  Transportation Needs:   . Lack of Transportation (Medical): Not on file  . Lack of Transportation (Non-Medical): Not on file  Physical Activity:   .  Days of Exercise per Week: Not on file  . Minutes of Exercise per Session: Not on file  Stress:   . Feeling of Stress : Not on file  Social Connections:   . Frequency of Communication with Friends and Family: Not on file  . Frequency of Social Gatherings with Friends and Family: Not on file  . Attends Religious Services: Not on file  . Active Member of Clubs or Organizations: Not on file  . Attends Banker Meetings: Not on file  . Marital Status: Not on file     Family History:  The patient's family history includes Bleeding Disorder in his sister.   ROS:   ROS   EKGs/Labs/Other Studies Reviewed:    Studies reviewed were summarized above. The additional studies were reviewed today:  2D echo 08/2017: - Left ventricle: The cavity size was normal. There was moderate  focal basal hypertrophy of the septum. Systolic function was  normal. The estimated ejection fraction was in the range of 55%  to 60%. Wall motion was normal; there were no regional wall  motion abnormalities. Doppler parameters are consistent with  abnormal left ventricular relaxation (grade 1 diastolic  dysfunction).  - Aortic valve: Trileaflet; mildly thickened, mildly calcified  leaflets. There was trivial regurgitation.  __________  Outpatient cardiac monitoring 08/2017:  The patient was monitored for 30 days.  The predominant rhythm was sinus with an average rate of 72 bpm.  PVCs were noted, including episodes of ventricular trigeminy.  No sustained arrhythmia or prolonged pause was identified.  Patient reported symptoms correspond to sinus rhythm with PVCs.   Sinus rhythm with PVCs. __________  Eugenie Birks Myoview 10/2017:  The left ventricular ejection fraction is mildly decreased (45-54%).  Nuclear stress EF: 53%.  There was no ST segment deviation noted during stress.  Defect 1: There is a small defect of mild severity present in the basal inferolateral and mid inferolateral location.  Findings consistent with prior myocardial infarction.  This is a low risk study.   Low risk stress nuclear study with a small inferolateral fixed perfusion defect and borderline reduced left ventricular global systolic function.   EKG:  EKG is ordered today.  The EKG ordered today demonstrates ***  Recent Labs: No results found for requested labs within last 8760 hours.  Recent Lipid Panel    Component Value Date/Time   CHOL 96 (L) 06/01/2018 1001   TRIG 101 06/01/2018 1001   HDL 38 (L) 06/01/2018 1001   CHOLHDL 2.5 06/01/2018 1001    LDLCALC 38 06/01/2018 1001    PHYSICAL EXAM:    VS:  There were no vitals taken for this visit.  BMI: There is no height or weight on file to calculate BMI.  Physical Exam  Wt Readings from Last 3 Encounters:  06/08/19 188 lb 8 oz (85.5 kg)  04/16/18 198 lb 3.2 oz (89.9 kg)  11/16/17 196 lb 6.4 oz (89.1 kg)     ASSESSMENT & PLAN:   1. ***  Disposition: F/u with Dr. Okey Dupre or an APP in ***.   Medication Adjustments/Labs and Tests Ordered: Current medicines are reviewed at length with the patient today.  Concerns regarding medicines are outlined above. Medication changes, Labs and Tests ordered today are summarized above and listed in the Patient Instructions accessible in Encounters.   Signed, Eula Listen, PA-C 01/23/2020 7:51 AM     [facility] - [place] [address] [place], [place] [address] [phone]

## 2020-01-25 ENCOUNTER — Ambulatory Visit: Payer: Medicare Other | Admitting: Physician Assistant

## 2020-01-26 ENCOUNTER — Encounter: Payer: Self-pay | Admitting: Physician Assistant

## 2020-01-29 ENCOUNTER — Other Ambulatory Visit: Payer: Self-pay | Admitting: Internal Medicine

## 2020-01-30 NOTE — Telephone Encounter (Signed)
Please schedule 6 month F/U with Dr. End. Thank you! 

## 2020-02-03 ENCOUNTER — Other Ambulatory Visit: Payer: Self-pay

## 2020-02-03 ENCOUNTER — Ambulatory Visit (INDEPENDENT_AMBULATORY_CARE_PROVIDER_SITE_OTHER): Payer: Medicare Other | Admitting: Internal Medicine

## 2020-02-03 ENCOUNTER — Encounter: Payer: Self-pay | Admitting: Internal Medicine

## 2020-02-03 VITALS — BP 144/80 | HR 60 | Ht 72.0 in | Wt 198.8 lb

## 2020-02-03 DIAGNOSIS — N529 Male erectile dysfunction, unspecified: Secondary | ICD-10-CM

## 2020-02-03 DIAGNOSIS — E785 Hyperlipidemia, unspecified: Secondary | ICD-10-CM | POA: Diagnosis not present

## 2020-02-03 DIAGNOSIS — R0789 Other chest pain: Secondary | ICD-10-CM

## 2020-02-03 DIAGNOSIS — I10 Essential (primary) hypertension: Secondary | ICD-10-CM

## 2020-02-03 MED ORDER — SILDENAFIL CITRATE 20 MG PO TABS: 20.0000 mg | ORAL_TABLET | Freq: Every day | ORAL | 0 refills | Status: DC | PRN

## 2020-02-03 NOTE — Patient Instructions (Addendum)
Info on DASH diet handout provided.   Medication Instructions:  Your physician recommends that you continue on your current medications as directed. Please refer to the Current Medication list given to you today.  Sildenafil 1-2 tablets (20-40 mg) by mouth daily as needed for erectile dysfunction.  *If you need a refill on your cardiac medications before your next appointment, please call your pharmacy*  Lab Work: none If you have labs (blood work) drawn today and your tests are completely normal, you will receive your results only by: Marland Kitchen MyChart Message (if you have MyChart) OR . A paper copy in the mail If you have any lab test that is abnormal or we need to change your treatment, we will call you to review the results.  Testing/Procedures: none  Follow-Up: At [facility], you and your health needs are our priority.  As part of our continuing mission to provide you with exceptional heart care, we have created designated Provider Care Teams.  These Care Teams include your primary Cardiologist (physician) and Advanced Practice Providers (APPs -  Physician Assistants and Nurse Practitioners) who all work together to provide you with the care you need, when you need it.  Your next appointment:   6 month(s)  The format for your next appointment:   In Person  Provider:    You may see Yvonne Kendall, MD or one of the following Advanced Practice Providers on your designated Care Team:    Nicolasa Ducking, NP  Eula Listen, PA-C  Marisue Ivan, PA-C

## 2020-02-03 NOTE — Progress Notes (Signed)
Follow-up Outpatient Visit Date: 02/03/2020  Primary Care Provider: [facility] @ [address] [place] [address]  Chief Complaint: Follow-up hypertension  HPI:  Ronald Ramirez is a 79 y.o. male with history of hypertension, hyperlipidemia, diabetes mellitus, and anxiety, who presents for follow-up of hypertension.  I last saw Ronald Ramirez in 05/2019, which time he was feeling well other than a few episodes of gas-like pain in the center of his chest.  It typically resolved on its own after 3 minutes or less and was not exertional.  Blood pressure was well controlled at that time.  He noted an episode of dizziness after taking 1 dose of rosuvastatin.  I encouraged him to try taking rosuvastatin again.  Today, Ronald Ramirez reports that he has been doing relatively well.  A few weeks ago, he began to notice mild facial and ankle swelling, particularly in the mornings.  He began taking furosemide 20 mg daily, which had been prescribed for an use on an as-needed basis by Dr. Kenton Kingfisher.  Since then, swelling has improved significantly.  He reports minimal chest pain and has not had any significant shortness of breath or palpitations.  He has occasional brief orthostatic lightheadedness without syncope or fall.  His blood pressures have been labile but typically range from 951-884 systolic.  He admits that he is not following a low-sodium diet.  Ronald Ramirez notes longstanding erectile dysfunction.  He is unable to maintain an erection and inquires about use of sildenafil.  --------------------------------------------------------------------------------------------------  Cardiovascular History & Procedures: Cardiovascular Problems:  Palpitations  Chest pain  Risk Factors:  Hypertension, hyperlipidemia, diabetes mellitus, male gender, and age greater than 70  Cath/PCI:  None  CV Surgery:  None  EP Procedures and Devices:  30-day event monitor  (09/01/17): Sinus rhythm with PVCs, including ventricular trigeminy. Patient reported symptoms correspond to PVCs.  Non-Invasive Evaluation(s):  Pharmacologic MPI (10/28/17): Low risk study with small in size, mild in severity fixed inferolateral defect that may represent scar or artifact. LVEF 53%.  TTE (09/01/17): Normal LV size with moderate focal basal hypertrophy of the septum. LVEF 55-60% with normal regional wall motion. Grade 1 diastolic dysfunction. Trileaflet, mildly thickened, mildly calcified aortic valve with trivial regurgitation. Normal RV size and function.  Recent CV Pertinent Labs: Lab Results  Component Value Date   CHOL 96 (L) 06/01/2018   HDL 38 (L) 06/01/2018   LDLCALC 38 06/01/2018   TRIG 101 06/01/2018   CHOLHDL 2.5 06/01/2018   K 4.3 04/16/2018   MG 1.6 04/16/2018   BUN 14 04/16/2018   CREATININE 1.29 (H) 04/16/2018    Past medical and surgical history were reviewed and updated in EPIC.  Current Meds  Medication Sig  . amLODipine (NORVASC) 10 MG tablet Take 10 mg by mouth daily.  . clonazePAM (KLONOPIN) 1 MG tablet Take 1 mg by mouth 2 (two) times daily.   . hydrochlorothiazide (HYDRODIURIL) 25 MG tablet Take 1 tablet (25 mg total) by mouth daily.  . insulin lispro protamine-lispro (HUMALOG 75/25 MIX) (75-25) 100 UNIT/ML SUSP injection Inject 20 Units into the skin 2 (two) times daily with a meal.  . losartan (COZAAR) 100 MG tablet TAKE 1 TABLET BY MOUTH EVERY DAY  . metoprolol succinate (TOPROL-XL) 100 MG 24 hr tablet Take 100 mg by mouth daily. Take with or immediately following a meal.  . rosuvastatin (CRESTOR) 5 MG tablet TAKE 1 TABLET BY MOUTH EVERY DAY    Allergies: Lantus [insulin glargine] and  Metformin and related  Social History   Tobacco Use  . Smoking status: Never Smoker  . Smokeless tobacco: Never Used  Substance Use Topics  . Alcohol use: No  . Drug use: No    Family History  Problem Relation Age of Onset  . Bleeding Disorder  Sister     Review of Systems: A 12-system review of systems was performed and was negative except as noted in the HPI.  --------------------------------------------------------------------------------------------------  Physical Exam: BP (!) 144/80 (BP Location: Left Arm, Patient Position: Sitting, Cuff Size: Normal)   Pulse 60   Ht 6' (1.829 m)   Wt 198 lb 12 oz (90.2 kg)   SpO2 98%   BMI 26.96 kg/m   General: NAD. Neck: Supple without lymphadenopathy, thyromegaly, JVD, or HJR. Lungs: Normal work of breathing. Clear to auscultation bilaterally without wheezes or crackles. Heart: Regular rate and rhythm without murmurs, rubs, or gallops. Non-displaced PMI. Abd: Bowel sounds present. Soft, NT/ND without hepatosplenomegaly Ext: No lower extremity edema.  Skin: Warm and dry without rash.  EKG: Normal sinus rhythm with nonspecific T wave changes.  Lab Results  Component Value Date   WBC 4.5 04/21/2015   HGB 14.4 04/21/2015   HCT 43.5 04/21/2015   MCV 69.3 (L) 04/21/2015   PLT 183 04/21/2015    Lab Results  Component Value Date   NA 136 04/16/2018   K 4.3 04/16/2018   CL 98 04/16/2018   CO2 24 04/16/2018   BUN 14 04/16/2018   CREATININE 1.29 (H) 04/16/2018   GLUCOSE 235 (H) 04/16/2018   ALT 23 06/01/2018    Lab Results  Component Value Date   CHOL 96 (L) 06/01/2018   HDL 38 (L) 06/01/2018   LDLCALC 38 06/01/2018   TRIG 101 06/01/2018   CHOLHDL 2.5 06/01/2018    --------------------------------------------------------------------------------------------------  ASSESSMENT AND PLAN: Hypertension: Blood pressure mildly elevated today but still somewhat labile at home.  We have agreed to work on lifestyle modifications to improve blood pressure control.  I have provided him with information regarding the DASH diet.  We will defer medication changes today.  Chest pain: Improved since our last visit.  No further work-up at this time.  Hyperlipidemia: Mr.  Ramirez is tolerating low-dose rosuvastatin.  He should continue taking this with target LDL less than 70.  Erectile dysfunction: Longstanding with inability to obtain erection.  We discussed risks and benefits of pharmacotherapy and have agreed to a trial of sildenafil 20 to 40 mg daily as needed for erectile dysfunction.  I counseled him to seek immediate medical attention for priapism, chest pain, or significant lightheadedness/dizziness.  Further management will be deferred to Dr. Tiburcio Pea.  Follow-up: Return to clinic in 6 months.  Yvonne Kendall, MD 02/03/2020 10:44 PM

## 2020-04-21 ENCOUNTER — Other Ambulatory Visit: Payer: Self-pay | Admitting: Internal Medicine

## 2020-04-25 ENCOUNTER — Other Ambulatory Visit: Payer: Self-pay | Admitting: Internal Medicine

## 2020-04-25 NOTE — Telephone Encounter (Signed)
I think it would be reasonable to stop magnesium supplementations this time.  A magnesium level can be checked the next time he has blood drawn through our office or with his PCP.

## 2020-04-25 NOTE — Telephone Encounter (Signed)
Patient has been having difficulty getting this prescription refilled.  Patient was last seen on 02/03/20.  If unable to refill, please call to discuss.   *STAT* If patient is at the pharmacy, call can be transferred to refill team.   1. Which medications need to be refilled? (please list name of each medication and dose if known) magnesium oxide 400 MG 1 tablet daily   2. Which pharmacy/location (including street and city if local pharmacy) is medication to be sent to? [facility] on [address] in [place]   3. Do they need a 30 day or 90 day supply? 90 day

## 2020-04-25 NOTE — Telephone Encounter (Signed)
Patient states he has been taking this medication once daily and has never stopped but this does not reflect on his last visit summary. OK to add back on?

## 2020-04-25 NOTE — Telephone Encounter (Signed)
Patient informed. 

## 2020-04-25 NOTE — Telephone Encounter (Signed)
Please advise if patient is to be taking this medication. It was discontinued from his med list. Thank you!

## 2020-05-26 ENCOUNTER — Other Ambulatory Visit: Payer: Self-pay | Admitting: Internal Medicine

## 2020-07-07 ENCOUNTER — Other Ambulatory Visit: Payer: Self-pay | Admitting: Internal Medicine

## 2020-08-08 ENCOUNTER — Other Ambulatory Visit: Payer: Self-pay

## 2020-08-08 ENCOUNTER — Encounter: Payer: Self-pay | Admitting: Internal Medicine

## 2020-08-08 ENCOUNTER — Ambulatory Visit: Payer: Medicare Other | Admitting: Internal Medicine

## 2020-08-08 VITALS — BP 136/76 | HR 57 | Ht 72.0 in | Wt 191.0 lb

## 2020-08-08 DIAGNOSIS — I10 Essential (primary) hypertension: Secondary | ICD-10-CM

## 2020-08-08 DIAGNOSIS — R0789 Other chest pain: Secondary | ICD-10-CM | POA: Diagnosis not present

## 2020-08-08 DIAGNOSIS — E785 Hyperlipidemia, unspecified: Secondary | ICD-10-CM | POA: Diagnosis not present

## 2020-08-08 DIAGNOSIS — R209 Unspecified disturbances of skin sensation: Secondary | ICD-10-CM

## 2020-08-08 NOTE — Progress Notes (Signed)
Follow-up Outpatient Visit Date: 08/08/2020  Primary Care Provider: [facility] @ [address] [place] [address]  Chief Complaint: Follow-up hypertension and chest pain  HPI:  Ronald Ramirez is a 79 y.o. male with history of hypertension, hyperlipidemia, diabetes mellitus, and anxiety, who presents for follow-up of hypertension.  I last saw him in February, at which time he was doing relatively well.  He noted mild facial and ankle swelling, particularly in the mornings, a few weeks prior to our visit.  Swelling improved with as needed furosemide use (had been prescribed by his PCP).  He reported minimal chest pain, improved from prior visits.  We agreed to defer additional testing.  A trial of sildenafil was pursued for management of erectile dysfunction.  Today, Ronald Ramirez notes that he has experienced intermittent chest discomfort.  He says it comes and goes and is not consistently exertional.  However, it seems a little more frequent than at prior visits.  It typically last 3 to 5 minutes and is without associated symptoms such as shortness of breath, palpitations, diaphoresis, and nausea.  He also feels like his feet are cold at times.  He does not have any paresthesias or pain in his legs with ambulation.  He notes occasional cramps in his calves at night.  Ronald Ramirez notes that his home blood pressures are typically in the 150s over 90s.  He has been compliant with his medications but endorses some sodium indiscretion.  --------------------------------------------------------------------------------------------------  Cardiovascular History & Procedures: Cardiovascular Problems:  Palpitations  Chest pain  Risk Factors:  Hypertension, hyperlipidemia, diabetes mellitus, male gender, and age greater than 37  Cath/PCI:  None  CV Surgery:  None  EP Procedures and Devices:  30-day event monitor (09/01/17): Sinus rhythm with PVCs,  including ventricular trigeminy. Patient reported symptoms correspond to PVCs.  Non-Invasive Evaluation(s):  Pharmacologic MPI (10/28/17): Low risk study with small in size, mild in severity fixed inferolateral defect that may represent scar or artifact. LVEF 53%.  TTE (09/01/17): Normal LV size with moderate focal basal hypertrophy of the septum. LVEF 55-60% with normal regional wall motion. Grade 1 diastolic dysfunction. Trileaflet, mildly thickened, mildly calcified aortic valve with trivial regurgitation. Normal RV size and function.  Recent CV Pertinent Labs: Lab Results  Component Value Date   CHOL 96 (L) 06/01/2018   HDL 38 (L) 06/01/2018   LDLCALC 38 06/01/2018   TRIG 101 06/01/2018   CHOLHDL 2.5 06/01/2018   K 4.3 04/16/2018   MG 1.6 04/16/2018   BUN 14 04/16/2018   CREATININE 1.29 (H) 04/16/2018    Past medical and surgical history were reviewed and updated in EPIC.  Current Meds  Medication Sig  . amLODipine (NORVASC) 10 MG tablet Take 10 mg by mouth daily.  . hydrochlorothiazide (HYDRODIURIL) 25 MG tablet TAKE 1 TABLET BY MOUTH EVERY DAY  . insulin lispro protamine-lispro (HUMALOG 75/25 MIX) (75-25) 100 UNIT/ML SUSP injection Inject 20 Units into the skin 2 (two) times daily with a meal.  . losartan (COZAAR) 100 MG tablet TAKE 1 TABLET BY MOUTH EVERY DAY  . metoprolol succinate (TOPROL-XL) 100 MG 24 hr tablet Take 100 mg by mouth daily. Take with or immediately following a meal.  . sildenafil (REVATIO) 20 MG tablet Take 1-2 tablets (20-40 mg total) by mouth daily as needed (for Erectile dysfunction.). Please contact PCP for further refills.    Allergies: Lantus [insulin glargine] and Metformin and related  Social History   Tobacco Use  .  Smoking status: Never Smoker  . Smokeless tobacco: Never Used  Vaping Use  . Vaping Use: Never used  Substance Use Topics  . Alcohol use: No  . Drug use: No    Family History  Problem Relation Age of Onset  . Bleeding  Disorder Sister     Review of Systems: A 12-system review of systems was performed and was negative except as noted in the HPI.  --------------------------------------------------------------------------------------------------  Physical Exam: BP 136/76 (BP Location: Left Arm, Patient Position: Sitting, Cuff Size: Normal)   Pulse (!) 57   Ht 6' (1.829 m)   Wt 191 lb (86.6 kg)   SpO2 98%   BMI 25.90 kg/m   General: NAD. Neck: No JVD or HJR. Lungs: Clear to auscultation without wheezes or crackles. Heart: Distant heart sounds.  Bradycardic but regular without murmurs. Abdomen: Soft, nontender, nondistended. Extremities: No lower extremity edema.  1+ posterior tibial and dorsalis pedis pulses bilaterally.  EKG: Sinus bradycardia (heart rate 57 bpm) with ossific T wave changes.  No significant change since 02/03/2020.  Lab Results  Component Value Date   WBC 4.5 04/21/2015   HGB 14.4 04/21/2015   HCT 43.5 04/21/2015   MCV 69.3 (L) 04/21/2015   PLT 183 04/21/2015    Lab Results  Component Value Date   NA 136 04/16/2018   K 4.3 04/16/2018   CL 98 04/16/2018   CO2 24 04/16/2018   BUN 14 04/16/2018   CREATININE 1.29 (H) 04/16/2018   GLUCOSE 235 (H) 04/16/2018   ALT 23 06/01/2018    Lab Results  Component Value Date   CHOL 96 (L) 06/01/2018   HDL 38 (L) 06/01/2018   LDLCALC 38 06/01/2018   TRIG 101 06/01/2018   CHOLHDL 2.5 06/01/2018    --------------------------------------------------------------------------------------------------  ASSESSMENT AND PLAN: Atypical chest pain: Chest pain is nonexertional and brief without associated symptoms.  Myocardial perfusion stress test in 2018 was notable for possible small area of inferolateral scar versus artifact.  Given multiple cardiac risk factors, I recommended that we repeat a pharmacologic myocardial perfusion stress test.  We will defer medication changes today.  Hypertension: Blood pressure is mildly elevated  today and often higher at home.  We have agreed to defer medication changes at this time, as Ronald Ramirez is already on maximal doses of amlodipine, HCTZ, and losartan.  Resting bradycardia also precludes further escalation of metoprolol.  We will work on sodium restriction.  Cold feet: Feet feel warm on exam today.  Pedal pulses are slightly diminished but palpable.  Ronald Ramirez is due for follow-up with his PCP next month and wishes to have labs drawn at that time.  [place] TC, chemistries, and TSH.  If he continues to have cold feet with unremarkable laboratory evaluation or develops other signs or symptoms of lower extremity PAD, will need to consider ABIs.  Hyperlipidemia: Atorvastatin was stopped at some point for unclear reasons.  Given history of diabetes, statin therapy is indicated.  I will defer ongoing management and lipid monitoring to his PCP.  Follow-up: Return to clinic after stress test.  Yvonne Kendall, MD 08/08/2020 9:20 PM

## 2020-08-08 NOTE — Patient Instructions (Addendum)
Medication Instructions:  Your physician recommends that you continue on your current medications as directed. Please refer to the Current Medication list given to you today.  *If you need a refill on your cardiac medications before your next appointment, please call your pharmacy*  Lab Work: none If you have labs (blood work) drawn today and your tests are completely normal, you will receive your results only by: Marland Kitchen MyChart Message (if you have MyChart) OR . A paper copy in the mail If you have any lab test that is abnormal or we need to change your treatment, we will call you to review the results.   Testing/Procedures:  Your physician has requested that you have a lexiscan myoview.   [facility] MYOVIEW  Your caregiver has ordered a Stress Test with nuclear imaging. The purpose of this test is to evaluate the blood supply to your heart muscle. This procedure is referred to as a "Non-Invasive Stress Test." This is because other than having an IV started in your vein, nothing is inserted or "invades" your body. Cardiac stress tests are done to find areas of poor blood flow to the heart by determining the extent of coronary artery disease (CAD). Some patients exercise on a treadmill, which naturally increases the blood flow to your heart, while others who are  unable to walk on a treadmill due to physical limitations have a pharmacologic/chemical stress agent called Lexiscan . This medicine will mimic walking on a treadmill by temporarily increasing your coronary blood flow.   Please note: these test may take anywhere between 2-4 hours to complete  PLEASE REPORT TO [facility] MEDICAL MALL ENTRANCE  THE VOLUNTEERS AT THE FIRST DESK WILL DIRECT YOU WHERE TO GO  Date of Procedure:_____________________________________  Arrival Time for Procedure:______________________________  Instructions regarding medication:   _x_ : Hold diabetes medication morning of procedure Do not take insulin the morning of  the procedure.   PLEASE NOTIFY THE OFFICE AT LEAST 24 HOURS IN ADVANCE IF YOU ARE UNABLE TO KEEP YOUR APPOINTMENT.  [phone] AND  PLEASE NOTIFY NUCLEAR MEDICINE AT [facility] AT LEAST 24 HOURS IN ADVANCE IF YOU ARE UNABLE TO KEEP YOUR APPOINTMENT. [phone]  How to prepare for your Myoview test:  1. Do not eat or drink after midnight 2. No caffeine for 24 hours prior to test 3. No smoking 24 hours prior to test. 4. Your medication may be taken with water.  If your doctor stopped a medication because of this test, do not take that medication. 5. Ladies, please do not wear dresses.  Skirts or pants are appropriate. Please wear a short sleeve shirt. 6. No perfume, cologne or lotion. 7. Wear comfortable walking shoes. No heels!   Follow-Up: At [facility], you and your health needs are our priority.  As part of our continuing mission to provide you with exceptional heart care, we have created designated Provider Care Teams.  These Care Teams include your primary Cardiologist (physician) and Advanced Practice Providers (APPs -  Physician Assistants and Nurse Practitioners) who all work together to provide you with the care you need, when you need it.  We recommend signing up for the patient portal called "MyChart".  Sign up information is provided on this After Visit Summary.  MyChart is used to connect with patients for Virtual Visits (Telemedicine).  Patients are able to view lab/test results, encounter notes, upcoming appointments, etc.  Non-urgent messages can be sent to your provider as well.   To learn more about what you can  do with MyChart, go to [email or web address].    Your next appointment:   After lexiscan with an APP.  The format for your next appointment:   In Person  Provider:    You may see one of the following Advanced Practice Providers on your designated Care Team:    Nicolasa Ducking, NP  Eula Listen, PA-C  Marisue Ivan, PA-C  Gillian Shields,  NP    Cardiac Nuclear Scan A cardiac nuclear scan is a test that measures blood flow to the heart when a person is resting and when he or she is exercising. The test looks for problems such as:  Not enough blood reaching a portion of the heart.  The heart muscle not working normally. You may need this test if:  You have heart disease.  You have had abnormal lab results.  You have had heart surgery or a balloon procedure to open up blocked arteries (angioplasty).  You have chest pain.  You have shortness of breath. In this test, a radioactive dye (tracer) is injected into your bloodstream. After the tracer has traveled to your heart, an imaging device is used to measure how much of the tracer is absorbed by or distributed to various areas of your heart. This procedure is usually done at a hospital and takes 2-4 hours. Tell a health care provider about:  Any allergies you have.  All medicines you are taking, including vitamins, herbs, eye drops, creams, and over-the-counter medicines.  Any problems you or family members have had with anesthetic medicines.  Any blood disorders you have.  Any surgeries you have had.  Any medical conditions you have.  Whether you are pregnant or may be pregnant. What are the risks? Generally, this is a safe procedure. However, problems may occur, including:  Serious chest pain and heart attack. This is only a risk if the stress portion of the test is done.  Rapid heartbeat.  Sensation of warmth in your chest. This usually passes quickly.  Allergic reaction to the tracer. What happens before the procedure?  Ask your health care provider about changing or stopping your regular medicines. This is especially important if you are taking diabetes medicines or blood thinners.  Follow instructions from your health care provider about eating or drinking restrictions.  Remove your jewelry on the day of the procedure. What happens during the  procedure?  An IV will be inserted into one of your veins.  Your health care provider will inject a small amount of radioactive tracer through the IV.  You will wait for 20-40 minutes while the tracer travels through your bloodstream.  Your heart activity will be monitored with an electrocardiogram (ECG).  You will lie down on an exam table.  Images of your heart will be taken for about 15-20 minutes.  You may also have a stress test. For this test, one of the following may be done: ? You will exercise on a treadmill or stationary bike. While you exercise, your heart's activity will be monitored with an ECG, and your blood pressure will be checked. ? You will be given medicines that will increase blood flow to parts of your heart. This is done if you are unable to exercise.  When blood flow to your heart has peaked, a tracer will again be injected through the IV.  After 20-40 minutes, you will get back on the exam table and have more images taken of your heart.  Depending on the type of tracer used, scans may need  to be repeated 3-4 hours later.  Your IV line will be removed when the procedure is over. The procedure may vary among health care providers and hospitals. What happens after the procedure?  Unless your health care provider tells you otherwise, you may return to your normal schedule, including diet, activities, and medicines.  Unless your health care provider tells you otherwise, you may increase your fluid intake. This will help to flush the contrast dye from your body. Drink enough fluid to keep your urine pale yellow.  Ask your health care provider, or the department that is doing the test: ? When will my results be ready? ? How will I get my results? Summary  A cardiac nuclear scan measures the blood flow to the heart when a person is resting and when he or she is exercising.  Tell your health care provider if you are pregnant.  Before the procedure, ask your  health care provider about changing or stopping your regular medicines. This is especially important if you are taking diabetes medicines or blood thinners.  After the procedure, unless your health care provider tells you otherwise, increase your fluid intake. This will help flush the contrast dye from your body.  After the procedure, unless your health care provider tells you otherwise, you may return to your normal schedule, including diet, activities, and medicines. This information is not intended to replace advice given to you by your health care provider. Make sure you discuss any questions you have with your health care provider. Document Revised: 05/24/2018 Document Reviewed: 05/24/2018 Elsevier Patient Education  2020 [employer].

## 2020-08-09 ENCOUNTER — Telehealth: Payer: Self-pay | Admitting: Internal Medicine

## 2020-08-09 NOTE — Telephone Encounter (Signed)
Attempted to schedule no ans no vm  

## 2020-08-13 ENCOUNTER — Other Ambulatory Visit: Payer: Self-pay | Admitting: Internal Medicine

## 2020-08-15 NOTE — Telephone Encounter (Signed)
-----   Message from Jeannetta Nap, RN sent at 08/15/2020  9:18 AM EDT ----- Regarding: Late check out from 08/08/20 Hi ya'll,  This patient left after 5 pm on 08/08/20. He need lexiscan and follow up with APP after to be scheduled.  Thanks! Britt Boozer

## 2020-08-15 NOTE — Telephone Encounter (Signed)
Attempted to schedule no ans no vm  

## 2020-08-28 NOTE — Telephone Encounter (Signed)
Attempted to schedule no ans no vm  

## 2020-09-13 ENCOUNTER — Other Ambulatory Visit: Payer: Self-pay | Admitting: Internal Medicine

## 2020-09-17 NOTE — Telephone Encounter (Signed)
Patient not available will call back when ready

## 2020-09-30 ENCOUNTER — Other Ambulatory Visit: Payer: Self-pay | Admitting: Internal Medicine

## 2020-11-08 ENCOUNTER — Other Ambulatory Visit: Payer: Self-pay | Admitting: Internal Medicine

## 2020-11-08 NOTE — Telephone Encounter (Signed)
Lmovm to verify if pt is taking Rosuvastatin dose and instructions needed If pt is taking.

## 2021-05-21 DIAGNOSIS — I10 Essential (primary) hypertension: Secondary | ICD-10-CM | POA: Diagnosis not present

## 2021-05-21 DIAGNOSIS — E1165 Type 2 diabetes mellitus with hyperglycemia: Secondary | ICD-10-CM | POA: Diagnosis not present

## 2021-05-21 DIAGNOSIS — R6889 Other general symptoms and signs: Secondary | ICD-10-CM | POA: Diagnosis not present

## 2021-05-21 DIAGNOSIS — E782 Mixed hyperlipidemia: Secondary | ICD-10-CM | POA: Diagnosis not present

## 2021-05-25 ENCOUNTER — Other Ambulatory Visit: Payer: Self-pay | Admitting: Internal Medicine

## 2021-05-27 ENCOUNTER — Other Ambulatory Visit: Payer: Self-pay

## 2021-05-27 NOTE — Telephone Encounter (Signed)
Attempted to schedule, LVM  

## 2021-05-27 NOTE — Telephone Encounter (Signed)
Duplicate request-Message left requesting to have patient call back to schedule F/U appointment.

## 2021-05-27 NOTE — Telephone Encounter (Signed)
*  STAT* If patient is at the pharmacy, call can be transferred to refill team.   1. Which medications need to be refilled? (please list name of each medication and dose if known) Losartan  2. Which pharmacy/location (including street and city if local pharmacy) is medication to be sent to? [facility] [place]  3. Do they need a 30 day or 90 day supply? 90

## 2021-05-27 NOTE — Telephone Encounter (Signed)
Please schedule F/U appointment. Thank you! 

## 2021-05-29 ENCOUNTER — Telehealth: Payer: Self-pay | Admitting: Internal Medicine

## 2021-05-29 NOTE — Telephone Encounter (Signed)
Refill sent to pharmacy today however patient is overdue for F/U appointment. Please schedule-thank you!

## 2021-05-29 NOTE — Telephone Encounter (Signed)
*  STAT* If patient is at the pharmacy, call can be transferred to refill team.   1. Which medications need to be refilled? (please list name of each medication and dose if known) Losartan 100mg  1 tablet daily,   2. Which pharmacy/location (including street and city if local pharmacy) is [facility]  3. Do they need a 30 day or 90 day supply? 90 day

## 2021-05-30 DIAGNOSIS — I10 Essential (primary) hypertension: Secondary | ICD-10-CM | POA: Diagnosis not present

## 2021-05-30 DIAGNOSIS — E782 Mixed hyperlipidemia: Secondary | ICD-10-CM | POA: Diagnosis not present

## 2021-05-30 DIAGNOSIS — E1169 Type 2 diabetes mellitus with other specified complication: Secondary | ICD-10-CM | POA: Diagnosis not present

## 2021-05-30 DIAGNOSIS — E1165 Type 2 diabetes mellitus with hyperglycemia: Secondary | ICD-10-CM | POA: Diagnosis not present

## 2021-05-30 NOTE — Telephone Encounter (Signed)
LMOV to schedule  

## 2021-06-03 ENCOUNTER — Other Ambulatory Visit: Payer: Self-pay | Admitting: Internal Medicine

## 2021-06-04 NOTE — Telephone Encounter (Signed)
Pt overdue for follow up visit pt needing visit for refills. Please contact pt never had lexiscan scheduled and due to f/u after lexiscan, thank you.

## 2021-06-04 NOTE — Telephone Encounter (Signed)
LVM for patient to call back and schedule

## 2021-06-12 NOTE — Telephone Encounter (Signed)
Attempted to schedule.  LMOV to call office.  ° °

## 2021-06-20 ENCOUNTER — Other Ambulatory Visit: Payer: Self-pay | Admitting: Internal Medicine

## 2021-06-20 NOTE — Telephone Encounter (Signed)
Pt overdue for 1 month f/u.  Please contact pt for future appointment. 

## 2021-06-20 NOTE — Telephone Encounter (Signed)
LMOV  

## 2021-06-26 ENCOUNTER — Ambulatory Visit: Payer: Medicare Other | Admitting: Internal Medicine

## 2021-06-26 ENCOUNTER — Other Ambulatory Visit: Payer: Self-pay

## 2021-06-26 ENCOUNTER — Encounter: Payer: Self-pay | Admitting: Internal Medicine

## 2021-06-26 VITALS — BP 132/60 | HR 68 | Ht 72.0 in | Wt 198.0 lb

## 2021-06-26 DIAGNOSIS — Z794 Long term (current) use of insulin: Secondary | ICD-10-CM

## 2021-06-26 DIAGNOSIS — R0789 Other chest pain: Secondary | ICD-10-CM | POA: Diagnosis not present

## 2021-06-26 DIAGNOSIS — I83813 Varicose veins of bilateral lower extremities with pain: Secondary | ICD-10-CM | POA: Diagnosis not present

## 2021-06-26 DIAGNOSIS — I10 Essential (primary) hypertension: Secondary | ICD-10-CM

## 2021-06-26 DIAGNOSIS — E119 Type 2 diabetes mellitus without complications: Secondary | ICD-10-CM

## 2021-06-26 MED ORDER — LOSARTAN POTASSIUM 100 MG PO TABS
100.0000 mg | ORAL_TABLET | Freq: Every day | ORAL | 3 refills | Status: DC
Start: 2021-06-26 — End: 2021-09-25

## 2021-06-26 NOTE — Patient Instructions (Signed)
Medication Instructions:  Your physician recommends that you continue on your current medications as directed. Please refer to the Current Medication list given to you today.  Dr. Okey Dupre  recommends knee HIGH COMPRESSION STOCKINGS  -- 15-20 mmHg (compression strength) -- [facility]  -- [address] [place]  -- [phone]  -- [facility]   -- [address] [place]   -- [phone] -- [facility]  -- [address] [place]  -- [phone]   How to Use Compression Stockings Compression stockings are elastic socks that squeeze the legs. They help to increase blood flow to the legs, decrease swelling in the legs, and reduce the chance of developing blood clots in the lower legs. Compression stockings are often used by people who: Are recovering from surgery. Have poor circulation in their legs. Are prone to getting blood clots in their legs. Have varicose veins. Sit or stay in bed for long periods of time. How to use compression stockings Before you put on your compression stockings: Make sure that they are the correct size. If you do not know your size, ask your health care provider. Make sure that they are clean, dry, and in good condition. Check them for rips and tears. Do not put them on if they are ripped or torn. Put your stockings on first thing in the morning, before you get out of bed. Keep them on for as long as your health care provider advises. When you are wearing your stockings: Keep them as smooth as possible. Do not allow them to bunch up. It is especially important to prevent the stockings from bunching up around your toes or behind your knees. Do not roll the stockings downward and leave them rolled down. This can decrease blood flow to your leg. Change them right away if they become wet or dirty. When you take off your stockings, inspect your legs and feet. Anything that does not seem normal may  require medical attention. Look for: Open sores. Red spots. Swelling. Information and tips Do not stop wearing your compression stockings without talking to your health care provider first. Wash your stockings every day with mild detergent in cold or warm water. Do not use bleach. Air-dry your stockings or dry them in a clothes dryer on low heat. Replace your stockings every 3-6 months. If skin moisturizing is part of your treatment plan, apply lotion or cream at night so that your skin will be dry when you put on the stockings in the morning. It is harder to put the stockings on when you have lotion on your legs or feet. Contact a health care provider if: Remove your stockings and seek medical care if: You have a feeling of pins and needles in your feet or legs. You have any new changes in your skin. You have skin lesions that are getting worse. You have swelling or pain that is getting worse. Get help right away if: You have numbness or tingling in your lower legs that does not get better right after you take the stockings off. Your toes or feet become cold and blue. You develop open sores or red spots on your legs that do not go away. You see or feel a warm spot on your leg. You have new swelling or soreness in your leg. You are short of breath or you have chest pain for no reason. You have a rapid or irregular heartbeat. You feel light-headed or dizzy. This information is not  intended to replace advice given to you by your health care provider. Make sure you discuss any questions you have with your health care provider. Document Released: 10/05/2009 Document Revised: 05/07/2016 Document Reviewed: 11/15/2014 Elsevier Interactive Patient Education  2017 [employer].    *If you need a refill on your cardiac medications before your next appointment, please call your pharmacy*   Lab Work: None ordered If you have labs (blood work) drawn today and your tests are completely normal,  you will receive your results only by: MyChart Message (if you have MyChart) OR A paper copy in the mail If you have any lab test that is abnormal or we need to change your treatment, we will call you to review the results.   Testing/Procedures: Your physician has requested that you have a lexiscan myoview. For further information please visit [email or web address] Please follow instruction sheet, as given. (To be scheduled in [place])   Follow-Up: At [facility], you and your health needs are our priority.  As part of our continuing mission to provide you with exceptional heart care, we have created designated Provider Care Teams.  These Care Teams include your primary Cardiologist (physician) and Advanced Practice Providers (APPs -  Physician Assistants and Nurse Practitioners) who all work together to provide you with the care you need, when you need it.  We recommend signing up for the patient portal called "MyChart".  Sign up information is provided on this After Visit Summary.  MyChart is used to connect with patients for Virtual Visits (Telemedicine).  Patients are able to view lab/test results, encounter notes, upcoming appointments, etc.  Non-urgent messages can be sent to your provider as well.   To learn more about what you can do with MyChart, go to [email or web address].    Your next appointment:   3 month(s)  The format for your next appointment:   In Person  Provider:   You may see Yvonne Kendall, MD or one of the following Advanced Practice Providers on your designated Care Team:   Nicolasa Ducking, NP Eula Listen, PA-C Marisue Ivan, PA-C Cadence F[place]reau, [place] Gillian Shields, NP   Other Instructions [facility] MYOVIEW  Your caregiver has ordered a Stress Test with nuclear imaging. The purpose of this test is to evaluate the blood supply to your heart muscle. This procedure is referred to as a "Non-Invasive Stress Test." This is because other than having an IV  started in your vein, nothing is inserted or "invades" your body. Cardiac stress tests are done to find areas of poor blood flow to the heart by determining the extent of coronary artery disease (CAD). Some patients exercise on a treadmill, which naturally increases the blood flow to your heart, while others who are  unable to walk on a treadmill due to physical limitations have a pharmacologic/chemical stress agent called Lexiscan . This medicine will mimic walking on a treadmill by temporarily increasing your coronary blood flow.   Please note: these test may take anywhere between 2-4 hours to complete  Date of Procedure:_____________________________________  Arrival Time for Procedure:______________________________  Instructions regarding medication:   __X__ : Hold diabetes medication morning of procedure  __X__:  Hold betablocker(Metoprolol) night before procedure and morning of procedure  __X__:  Hold other medications as follows:Lasix and HCTZ the morning of the test. You can take them later that day.  PLEASE NOTIFY THE OFFICE AT LEAST 24 HOURS IN ADVANCE IF YOU ARE UNABLE TO KEEP YOUR APPOINTMENT.  [phone]   How to prepare for  your Myoview test:  Do not eat or drink after midnight No caffeine for 24 hours prior to test No smoking 24 hours prior to test. Your medication may be taken with water.  If your doctor stopped a medication because of this test, do not take that medication. Please wear a short sleeve shirt. No cologne or lotion. Wear comfortable walking shoes. No heels!

## 2021-06-26 NOTE — Progress Notes (Signed)
Follow-up Outpatient Visit Date: 06/26/2021  Primary Care Provider: [facility] @ [address] [place] [address]  Chief Complaint: Chest soreness  HPI:  Ronald Ramirez is a 80 y.o. male with history of hypertension, hyperlipidemia, type 2 diabetes mellitus, and anxiety, who presents for follow-up of chest pain.  I last saw Ronald Ramirez in 07/2020, at which time he endorsed intermittent chest pain not consistently associated with exertion.  We recommended repeating a pharmacologic myocardial perfusion stress test, which Ronald Ramirez did not move forward with.  Today, Ronald Ramirez reports that he feels relatively well.  He denies frank chest pain but endorses "soreness" in the center of his chest.  It occurs randomly, usually once or twice a week, and lasts 10-15 minutes.  It resolves on its own.  He notices similar pain in both arms at times, but does not believe that it is associated with the aforementioned chest soreness.  He denies shortness of breath, palpitations, and lightheadedness.  He uses furosemide every morning due to subtle swelling in the face.  Ronald Ramirez notes swelling in his calves with prominent veins.  He has not seen a vascular specialist in the past  At times, his calves ache.  His feel are also cold on occasion.  --------------------------------------------------------------------------------------------------  Cardiovascular History & Procedures: Cardiovascular Problems: Palpitations Chest pain   Risk Factors: Hypertension, hyperlipidemia, diabetes mellitus, male gender, and age greater than 11   Cath/PCI: None   CV Surgery: None   EP Procedures and Devices: 30-day event monitor (09/01/17): Sinus rhythm with PVCs, including ventricular trigeminy.  Patient reported symptoms correspond to PVCs.   Non-Invasive Evaluation(s): Pharmacologic MPI (10/28/17): Low risk study with small in size, mild in severity fixed  inferolateral defect that may represent scar or artifact. LVEF 53%. TTE (09/01/17): Normal LV size with moderate focal basal hypertrophy of the septum.  LVEF 55-60% with normal regional wall motion.  Grade 1 diastolic dysfunction.  Trileaflet, mildly thickened, mildly calcified aortic valve with trivial regurgitation.  Normal RV size and function.  Recent CV Pertinent Labs: Lab Results  Component Value Date   CHOL 96 (L) 06/01/2018   HDL 38 (L) 06/01/2018   LDLCALC 38 06/01/2018   TRIG 101 06/01/2018   CHOLHDL 2.5 06/01/2018   K 4.3 04/16/2018   MG 1.6 04/16/2018   BUN 14 04/16/2018   CREATININE 1.29 (H) 04/16/2018    Past medical and surgical history were reviewed and updated in EPIC.  Current Meds  Medication Sig   amLODipine (NORVASC) 10 MG tablet Take 10 mg by mouth daily.   furosemide (LASIX) 20 MG tablet Take 20 mg by mouth daily as needed.   hydrochlorothiazide (HYDRODIURIL) 25 MG tablet TAKE 1 TABLET BY MOUTH EVERY DAY   insulin lispro protamine-lispro (HUMALOG 75/25 MIX) (75-25) 100 UNIT/ML SUSP injection Inject 20 Units into the skin 2 (two) times daily with a meal.   losartan (COZAAR) 100 MG tablet Take 1 tablet (100 mg total) by mouth daily. FINAL REFILL UNTIL SEEN IN OFFICE. PLEASE CALL TO SCHEDULE. THANK YOU!   metoprolol succinate (TOPROL-XL) 100 MG 24 hr tablet Take 100 mg by mouth daily. Take with or immediately following a meal.   sildenafil (REVATIO) 20 MG tablet Take 1-2 tablets (20-40 mg total) by mouth daily as needed (for Erectile dysfunction.). Please contact PCP for further refills.    Allergies: Lantus [insulin glargine] and Metformin and related  Social History   Tobacco Use   Smoking status:  Never   Smokeless tobacco: Never  Vaping Use   Vaping Use: Never used  Substance Use Topics   Alcohol use: No   Drug use: No    Family History  Problem Relation Age of Onset   Bleeding Disorder Sister     Review of Systems: A 12-system review of systems  was performed and was negative except as noted in the HPI.  --------------------------------------------------------------------------------------------------  Physical Exam: BP 132/60 (BP Location: Left Arm, Patient Position: Sitting, Cuff Size: Normal)   Pulse 68   Ht 6' (1.829 m)   Wt 198 lb (89.8 kg)   SpO2 96%   BMI 26.85 kg/m   General:  NAD. Neck: No JVD or HJR. Lungs: Clear to auscultation bilaterally without wheezes or crackles. Heart: Regular rate and rhythm without murmurs, rubs, or gallops. Abdomen: Soft, nontender, nondistended. Extremities: No lower extremity edema.  Varicose veins noted in both calves.  2+ pedal pulses bilaterally.  EKG:  Normal sinus rhythm with nonspecific T wave abnormality.  Heart rate has increased since 08/08/2020.  Otherwise, there has been no significant interval change.  Lab Results  Component Value Date   WBC 4.5 04/21/2015   HGB 14.4 04/21/2015   HCT 43.5 04/21/2015   MCV 69.3 (L) 04/21/2015   PLT 183 04/21/2015    Lab Results  Component Value Date   NA 136 04/16/2018   K 4.3 04/16/2018   CL 98 04/16/2018   CO2 24 04/16/2018   BUN 14 04/16/2018   CREATININE 1.29 (H) 04/16/2018   GLUCOSE 235 (H) 04/16/2018   ALT 23 06/01/2018    Lab Results  Component Value Date   CHOL 96 (L) 06/01/2018   HDL 38 (L) 06/01/2018   LDLCALC 38 06/01/2018   TRIG 101 06/01/2018   CHOLHDL 2.5 06/01/2018    --------------------------------------------------------------------------------------------------  ASSESSMENT AND PLAN: Atypical chest pain: Ronald Ramirez continues to have intermittent atypical chest pain.  We had previously agreed to move forward with pharmacologic myocardial perfusion stress test, though he did not go forward with this.  We have discussed risks and benefits of the study and will proceed in our [place] office.  We will defer medication changes at this time.  Hypertension: Blood pressure mildly elevated today (goal <  130/80).  We have discussed escalation of pharmacotherapy, though Ronald Ramirez wishes to defer at this time and continue to monitor his blood pressure at home.  He should alert [place] if his BP is consistent above 130/80.  Varicose veins: Noted on exam today.  We will try compression stockings to see if that helps with his calf achiness.  If not, we will refer him to vascular surgery in [place].  Type 2 diabetes mellitus: Continue insulin, with ongoing management per PCP.  Shared Decision Making/Informed Consent The risks [chest pain, shortness of breath, cardiac arrhythmias, dizziness, blood pressure fluctuations, myocardial infarction, stroke/transient ischemic attack, nausea, vomiting, allergic reaction, radiation exposure, metallic taste sensation and life-threatening complications (estimated to be 1 in 10,000)], benefits (risk stratification, diagnosing coronary artery disease, treatment guidance) and alternatives of a nuclear stress test were discussed in detail with Ronald Ramirez and he agrees to proceed.  Follow-up: RTC 3 months.  Yvonne Kendall, MD 06/26/2021 2:52 PM

## 2021-06-27 ENCOUNTER — Encounter: Payer: Self-pay | Admitting: Internal Medicine

## 2021-06-27 DIAGNOSIS — E119 Type 2 diabetes mellitus without complications: Secondary | ICD-10-CM | POA: Insufficient documentation

## 2021-06-27 DIAGNOSIS — Z794 Long term (current) use of insulin: Secondary | ICD-10-CM | POA: Insufficient documentation

## 2021-06-27 DIAGNOSIS — I83813 Varicose veins of bilateral lower extremities with pain: Secondary | ICD-10-CM | POA: Insufficient documentation

## 2021-07-08 ENCOUNTER — Telehealth (HOSPITAL_COMMUNITY): Payer: Self-pay | Admitting: *Deleted

## 2021-07-08 NOTE — Telephone Encounter (Signed)
Patient given detailed instructions per Myocardial Perfusion Study Information Sheet for the test on 07/12/21 Patient notified to arrive 15 minutes early and that it is imperative to arrive on time for appointment to keep from having the test rescheduled.  If you need to cancel or reschedule your appointment, please call the office within 24 hours of your appointment. . Patient verbalized understanding. Ronald Ramirez Jacqueline   

## 2021-07-12 ENCOUNTER — Ambulatory Visit (HOSPITAL_COMMUNITY): Payer: Medicare Other | Attending: Internal Medicine

## 2021-07-12 ENCOUNTER — Other Ambulatory Visit: Payer: Self-pay

## 2021-07-12 DIAGNOSIS — R0789 Other chest pain: Secondary | ICD-10-CM

## 2021-07-12 LAB — MYOCARDIAL PERFUSION IMAGING
LV dias vol: 88 mL (ref 62–150)
LV sys vol: 33 mL
Peak HR: 79 {beats}/min
Rest HR: 63 {beats}/min
SDS: 1
SRS: 0
SSS: 1
TID: 1.07

## 2021-07-12 MED ORDER — TECHNETIUM TC 99M TETROFOSMIN IV KIT
32.3000 | PACK | Freq: Once | INTRAVENOUS | Status: AC | PRN
  Administered 2021-07-12: 32.3 via INTRAVENOUS
  Filled 2021-07-12: qty 33

## 2021-07-12 MED ORDER — TECHNETIUM TC 99M TETROFOSMIN IV KIT
10.3000 | PACK | Freq: Once | INTRAVENOUS | Status: AC | PRN
Start: 2021-07-12 — End: 2021-07-12
  Administered 2021-07-12: 10.3 via INTRAVENOUS
  Filled 2021-07-12: qty 11

## 2021-07-12 MED ORDER — REGADENOSON 0.4 MG/5ML IV SOLN
0.4000 mg | Freq: Once | INTRAVENOUS | Status: AC
  Administered 2021-07-12: 0.4 mg via INTRAVENOUS

## 2021-07-12 MED ORDER — AMINOPHYLLINE 25 MG/ML IV SOLN
75.0000 mg | Freq: Once | INTRAVENOUS | Status: AC
  Administered 2021-07-12: 75 mg via INTRAVENOUS

## 2021-07-15 ENCOUNTER — Telehealth: Payer: Self-pay | Admitting: *Deleted

## 2021-07-15 NOTE — Telephone Encounter (Signed)
Attempted to call pt with results. No answer. Lmtcb.  

## 2021-07-15 NOTE — Telephone Encounter (Signed)
Patient calling back. Please advise

## 2021-07-15 NOTE — Telephone Encounter (Signed)
-----   Message from Yvonne Kendall, MD sent at 07/14/2021  8:25 PM EDT ----- Please let Mr. Bonawitz know that his stress test is normal without evidence of a significant blockage.  I recommend that he continue his current medications and follow-up as previously arranged.

## 2021-07-15 NOTE — Telephone Encounter (Signed)
Spoke to pt. Notified of stress test results and provider's recc.  Pt voiced understanding. He will follow up 09/25/21 as scheduled.

## 2021-08-23 DIAGNOSIS — E782 Mixed hyperlipidemia: Secondary | ICD-10-CM | POA: Diagnosis not present

## 2021-08-23 DIAGNOSIS — Z0000 Encounter for general adult medical examination without abnormal findings: Secondary | ICD-10-CM | POA: Diagnosis not present

## 2021-08-23 DIAGNOSIS — I10 Essential (primary) hypertension: Secondary | ICD-10-CM | POA: Diagnosis not present

## 2021-08-23 DIAGNOSIS — E1165 Type 2 diabetes mellitus with hyperglycemia: Secondary | ICD-10-CM | POA: Diagnosis not present

## 2021-09-25 ENCOUNTER — Other Ambulatory Visit: Payer: Self-pay

## 2021-09-25 ENCOUNTER — Ambulatory Visit: Payer: Medicare Other | Admitting: Internal Medicine

## 2021-09-25 ENCOUNTER — Encounter: Payer: Self-pay | Admitting: Internal Medicine

## 2021-09-25 VITALS — BP 160/92 | HR 74 | Ht 72.0 in | Wt 198.0 lb

## 2021-09-25 DIAGNOSIS — Z794 Long term (current) use of insulin: Secondary | ICD-10-CM | POA: Diagnosis not present

## 2021-09-25 DIAGNOSIS — I10 Essential (primary) hypertension: Secondary | ICD-10-CM | POA: Diagnosis not present

## 2021-09-25 DIAGNOSIS — R209 Unspecified disturbances of skin sensation: Secondary | ICD-10-CM

## 2021-09-25 DIAGNOSIS — E1151 Type 2 diabetes mellitus with diabetic peripheral angiopathy without gangrene: Secondary | ICD-10-CM | POA: Diagnosis not present

## 2021-09-25 DIAGNOSIS — R0989 Other specified symptoms and signs involving the circulatory and respiratory systems: Secondary | ICD-10-CM

## 2021-09-25 DIAGNOSIS — R0789 Other chest pain: Secondary | ICD-10-CM

## 2021-09-25 DIAGNOSIS — I2510 Atherosclerotic heart disease of native coronary artery without angina pectoris: Secondary | ICD-10-CM

## 2021-09-25 MED ORDER — LOSARTAN POTASSIUM 100 MG PO TABS: 100.0000 mg | ORAL_TABLET | Freq: Every day | ORAL | 3 refills | Status: DC

## 2021-09-25 NOTE — Progress Notes (Signed)
Follow-up Outpatient Visit Date: 09/25/2021  Primary Care Provider: [facility] @ [address] [place] [address]  Chief Complaint: Cold feet and palpitations  HPI:  Ronald Ramirez is a 80 y.o. male with history of hypertension, hyperlipidemia, type 2 diabetes mellitus, and anxiety, who presents for follow-up of chest pain.  I last saw him in July, at which time Ronald Ramirez reported occasional "soreness" in the center of his chest that would occur randomly and last 10 to 15 minutes.  Subsequent myocardial perfusion stress test in late July was low risk without evidence of ischemia or scar.  Today Ronald Ramirez reports that he has been feeling relatively well.  He still notes intermittent palpitations.  The rhythm of his hard beats has changed a little bit but overall the frequency remains about the same.  He denies associated symptoms.  He still has vague soreness in his chest at times, which is not exertional.  It is stable from prior visits.  He denies shortness of breath, lightheadedness, and edema.  He reports a near-syncopal episode about 2 weeks ago following fasting labs drawn by his PCP.  He returned to his car and was unable to speak per his wife's report.  He felt better after eating something.  He had been fasting into the early afternoon but has taken his insulin as usual.  He does not recall what his blood sugar was.  He has not had any further events.  Ronald Ramirez reports that his feet have been feeling cold for about 6 months.  It is uncomfortable but he denies pain when walking in his feet and calves.  He also denies wounds/sores, though his wife notices that his toenails appear discolored.  He does not see a podiatrist.  Ronald Ramirez reports that he may have missed one of his BP medications today.  He and his wife also had some difficulty finding there way to the office  today.  --------------------------------------------------------------------------------------------------  Cardiovascular History & Procedures: Cardiovascular Problems: Palpitations Chest pain   Risk Factors: Hypertension, hyperlipidemia, diabetes mellitus, male gender, and age greater than 64   Cath/PCI: None   CV Surgery: None   EP Procedures and Devices: 30-day event monitor (09/01/17): Sinus rhythm with PVCs, including ventricular trigeminy.  Patient reported symptoms correspond to PVCs.   Non-Invasive Evaluation(s): Pharmacologic MPI (07/12/2021): Low risk study without evidence of ischemia or scar.  LVEF 63%. Pharmacologic MPI (10/28/17): Low risk study with small in size, mild in severity fixed inferolateral defect that may represent scar or artifact. LVEF 53%. TTE (09/01/17): Normal LV size with moderate focal basal hypertrophy of the septum.  LVEF 55-60% with normal regional wall motion.  Grade 1 diastolic dysfunction.  Trileaflet, mildly thickened, mildly calcified aortic valve with trivial regurgitation.  Normal RV size and function.  Recent CV Pertinent Labs: Lab Results  Component Value Date   CHOL 96 (L) 06/01/2018   HDL 38 (L) 06/01/2018   LDLCALC 38 06/01/2018   TRIG 101 06/01/2018   CHOLHDL 2.5 06/01/2018   K 4.3 04/16/2018   MG 1.6 04/16/2018   BUN 14 04/16/2018   CREATININE 1.29 (H) 04/16/2018    Past medical and surgical history were reviewed and updated in EPIC.  Current Meds  Medication Sig   amLODipine (NORVASC) 10 MG tablet Take 10 mg by mouth daily.   clonazePAM (KLONOPIN) 1 MG tablet Take 1 mg by mouth 2 (two) times daily.   hydrochlorothiazide (HYDRODIURIL) 25 MG tablet TAKE 1  TABLET BY MOUTH EVERY DAY   insulin lispro protamine-lispro (HUMALOG 75/25 MIX) (75-25) 100 UNIT/ML SUSP injection Inject 20 Units into the skin 2 (two) times daily with a meal.   losartan (COZAAR) 100 MG tablet Take 1 tablet (100 mg total) by mouth daily.   metFORMIN  (GLUCOPHAGE-XR) 500 MG 24 hr tablet Take 250 mg by mouth 2 (two) times daily.   metoprolol succinate (TOPROL-XL) 100 MG 24 hr tablet Take 100 mg by mouth daily. Take with or immediately following a meal.    Allergies: Lantus [insulin glargine] and Metformin and related  Social History   Tobacco Use   Smoking status: Never   Smokeless tobacco: Never  Vaping Use   Vaping Use: Never used  Substance Use Topics   Alcohol use: No   Drug use: No    Family History  Problem Relation Age of Onset   Bleeding Disorder Sister     Review of Systems: A 12-system review of systems was performed and was negative except as noted in the HPI.  --------------------------------------------------------------------------------------------------  Physical Exam: BP (!) 160/92 (BP Location: Left Arm, Patient Position: Sitting, Cuff Size: Large)   Pulse 74   Ht 6' (1.829 m)   Wt 198 lb (89.8 kg)   SpO2 96%   BMI 26.85 kg/m   General:  NAD. Neck: No JVD or HJR. Lungs: Clear to auscultation bilaterally without wheezes or crackles. Heart: Regular rate and rhythm without murmurs, rubs, or gallops. Abdomen: Soft, nontender, nondistended. Extremities: No lower extremity edema.  1+ dorsalis pedis pulses bilaterally.  Posterior tibial pulses absent bilaterally.  No wounds/ulcers on either foot.  Toenail thickening and darkening noted.  EKG:  NSR with nonspecific T wave abnormality.  No significant change since 06/26/2021.  Lab Results  Component Value Date   WBC 4.5 04/21/2015   HGB 14.4 04/21/2015   HCT 43.5 04/21/2015   MCV 69.3 (L) 04/21/2015   PLT 183 04/21/2015    Lab Results  Component Value Date   NA 136 04/16/2018   K 4.3 04/16/2018   CL 98 04/16/2018   CO2 24 04/16/2018   BUN 14 04/16/2018   CREATININE 1.29 (H) 04/16/2018   GLUCOSE 235 (H) 04/16/2018   ALT 23 06/01/2018    Lab Results  Component Value Date   CHOL 96 (L) 06/01/2018   HDL 38 (L) 06/01/2018   LDLCALC 38  06/01/2018   TRIG 101 06/01/2018   CHOLHDL 2.5 06/01/2018    --------------------------------------------------------------------------------------------------  ASSESSMENT AND PLAN: Atypical chest pain: Pain remains vague and non-exertional.  It is stable since July, with interval MPI being low risk without ischemia or scar.  No further workup or intervention is planned at this time.  Hypertension: BP poorly controlled today in the setting of stress related to missing exit on drive to the office today as well as missing at least one of his BP medications.  We discussed the importance of medication compliance and sodium restriction.  We will defer medication changes today.  Diabetes mellitus and concern for PAD: Ronald Ramirez reports cold feet for at least 6 months and is noted to have decreased pedal pulses in both feet.  Given history of diabetes, we will refer him for ABI's and lower extremity arterial duplex studies.  Podiatry evaluation may also be helpful, though we will defer this pending vascular studies.  Continue ongoing DM management per PCP.  Follow-up: Return to clinic in 3 months.  Yvonne Kendall, MD 09/25/2021 4:21 PM

## 2021-09-25 NOTE — Patient Instructions (Signed)
Medication Instructions:   Your physician recommends that you continue on your current medications as directed. Please refer to the Current Medication list given to you today.  *If you need a refill on your cardiac medications before your next appointment, please call your pharmacy*   Lab Work:  None ordered  Testing/Procedures:  1) Your physician has requested that you have a lower extremity arterial exercise duplex. During this test, exercise and ultrasound are used to evaluate arterial blood flow in the legs. Allow one hour for this exam. There are no restrictions or special instructions.  2) Your physician has requested that you have an ankle brachial index (ABI). During this test an ultrasound and blood pressure cuff are used to evaluate the arteries that supply the arms and legs with blood. Allow thirty minutes for this exam. There are no restrictions or special instructions.    Follow-Up: At [facility], you and your health needs are our priority.  As part of our continuing mission to provide you with exceptional heart care, we have created designated Provider Care Teams.  These Care Teams include your primary Cardiologist (physician) and Advanced Practice Providers (APPs -  Physician Assistants and Nurse Practitioners) who all work together to provide you with the care you need, when you need it.  We recommend signing up for the patient portal called "MyChart".  Sign up information is provided on this After Visit Summary.  MyChart is used to connect with patients for Virtual Visits (Telemedicine).  Patients are able to view lab/test results, encounter notes, upcoming appointments, etc.  Non-urgent messages can be sent to your provider as well.   To learn more about what you can do with MyChart, go to [email or web address].    Your next appointment:   3 month(s)  The format for your next appointment:   In Person  Provider:   You may see Yvonne Kendall, MD or one of the  following Advanced Practice Providers on your designated Care Team:   Nicolasa Ducking, NP Eula Listen, PA-C Marisue Ivan, PA-C Cadence [place], [place]

## 2021-09-26 ENCOUNTER — Encounter: Payer: Self-pay | Admitting: Internal Medicine

## 2021-09-26 DIAGNOSIS — R0989 Other specified symptoms and signs involving the circulatory and respiratory systems: Secondary | ICD-10-CM | POA: Insufficient documentation

## 2021-09-26 DIAGNOSIS — E1151 Type 2 diabetes mellitus with diabetic peripheral angiopathy without gangrene: Secondary | ICD-10-CM | POA: Insufficient documentation

## 2021-09-26 DIAGNOSIS — Z794 Long term (current) use of insulin: Secondary | ICD-10-CM | POA: Insufficient documentation

## 2021-11-01 ENCOUNTER — Other Ambulatory Visit: Payer: Self-pay

## 2021-11-01 ENCOUNTER — Ambulatory Visit (HOSPITAL_COMMUNITY)
Admission: RE | Admit: 2021-11-01 | Discharge: 2021-11-01 | Disposition: A | Discharge status: Home / Self Care | Payer: Medicare Other | Source: Ambulatory Visit | Attending: Cardiology | Admitting: Cardiology

## 2021-11-01 ENCOUNTER — Encounter (HOSPITAL_COMMUNITY): Payer: Medicare Other

## 2021-11-01 DIAGNOSIS — R0989 Other specified symptoms and signs involving the circulatory and respiratory systems: Secondary | ICD-10-CM | POA: Insufficient documentation

## 2021-11-01 DIAGNOSIS — R209 Unspecified disturbances of skin sensation: Secondary | ICD-10-CM | POA: Insufficient documentation

## 2021-11-02 ENCOUNTER — Other Ambulatory Visit: Payer: Self-pay | Admitting: Internal Medicine

## 2021-11-07 DIAGNOSIS — Z0000 Encounter for general adult medical examination without abnormal findings: Secondary | ICD-10-CM | POA: Diagnosis not present

## 2021-11-07 DIAGNOSIS — E119 Type 2 diabetes mellitus without complications: Secondary | ICD-10-CM | POA: Diagnosis not present

## 2021-11-07 DIAGNOSIS — I10 Essential (primary) hypertension: Secondary | ICD-10-CM | POA: Diagnosis not present

## 2021-11-07 DIAGNOSIS — Z794 Long term (current) use of insulin: Secondary | ICD-10-CM | POA: Diagnosis not present

## 2022-01-01 ENCOUNTER — Ambulatory Visit: Payer: Medicare Other | Admitting: Internal Medicine

## 2022-01-01 NOTE — Progress Notes (Deleted)
Follow-up Outpatient Visit Date: 01/01/2022  Primary Care Provider: [facility] @ [address] [place] [address]  Chief Complaint: ***  HPI:  Ronald Ramirez is a 81 y.o. male with history of hypertension, hyperlipidemia, type 2 diabetes mellitus, and anxiety, who presents for follow-up of chest pain and palpitations.  I last saw him in October, at which time he complained of intermittent palpitations and vague soreness in his chest.  He was also concerned about his feet feeling cold for the last 6 months as well as an uncomfortable feeling in his feet and calves.  Lower extremity vascular studies showed possible disease involving both posterior tibial arteries though ABIs and TBI's were normal.  --------------------------------------------------------------------------------------------------  Cardiovascular History & Procedures: Cardiovascular Problems: Palpitations Chest pain   Risk Factors: Hypertension, hyperlipidemia, diabetes mellitus, male gender, and age greater than 66   Cath/PCI: None   CV Surgery: None   EP Procedures and Devices: 30-day event monitor (09/01/17): Sinus rhythm with PVCs, including ventricular trigeminy.  Patient reported symptoms correspond to PVCs.   Non-Invasive Evaluation(s): ABIs (11/01/2021): Normal ABIs and TBI's bilaterally though possible disease involving both posterior tibial arteries noted. Pharmacologic MPI (07/12/2021): Low risk study without evidence of ischemia or scar.  LVEF 63%. Pharmacologic MPI (10/28/17): Low risk study with small in size, mild in severity fixed inferolateral defect that may represent scar or artifact. LVEF 53%. TTE (09/01/17): Normal LV size with moderate focal basal hypertrophy of the septum.  LVEF 55-60% with normal regional wall motion.  Grade 1 diastolic dysfunction.  Trileaflet, mildly thickened, mildly calcified aortic valve with trivial regurgitation.  Normal RV size and  function.  Recent CV Pertinent Labs: Lab Results  Component Value Date   CHOL 96 (L) 06/01/2018   HDL 38 (L) 06/01/2018   LDLCALC 38 06/01/2018   TRIG 101 06/01/2018   CHOLHDL 2.5 06/01/2018   K 4.3 04/16/2018   MG 1.6 04/16/2018   BUN 14 04/16/2018   CREATININE 1.29 (H) 04/16/2018    Past medical and surgical history were reviewed and updated in EPIC.  No outpatient medications have been marked as taking for the 01/01/22 encounter (Appointment) with Arun Herrod, Ronald Deer, MD.    Allergies: Lantus [insulin glargine] and Metformin and related  Social History   Tobacco Use   Smoking status: Never   Smokeless tobacco: Never  Vaping Use   Vaping Use: Never used  Substance Use Topics   Alcohol use: No   Drug use: No    Family History  Problem Relation Age of Onset   Bleeding Disorder Sister     Review of Systems: A 12-system review of systems was performed and was negative except as noted in the HPI.  --------------------------------------------------------------------------------------------------  Physical Exam: There were no vitals taken for this visit.  General:  NAD. Neck: No JVD or HJR. Lungs: Clear to auscultation bilaterally without wheezes or crackles. Heart: Regular rate and rhythm without murmurs, rubs, or gallops. Abdomen: Soft, nontender, nondistended. Extremities: No lower extremity edema.  EKG:  ***  Lab Results  Component Value Date   WBC 4.5 04/21/2015   HGB 14.4 04/21/2015   HCT 43.5 04/21/2015   MCV 69.3 (L) 04/21/2015   PLT 183 04/21/2015    Lab Results  Component Value Date   NA 136 04/16/2018   K 4.3 04/16/2018   CL 98 04/16/2018   CO2 24 04/16/2018   BUN 14 04/16/2018   CREATININE 1.29 (H) 04/16/2018   GLUCOSE 235 (H) 04/16/2018  ALT 23 06/01/2018    Lab Results  Component Value Date   CHOL 96 (L) 06/01/2018   HDL 38 (L) 06/01/2018   LDLCALC 38 06/01/2018   TRIG 101 06/01/2018   CHOLHDL 2.5 06/01/2018     --------------------------------------------------------------------------------------------------  ASSESSMENT AND PLAN: Ronald Ramirez Atwood Adcock, MD 01/01/2022 8:53 AM

## 2022-02-03 ENCOUNTER — Other Ambulatory Visit: Payer: Self-pay | Admitting: Internal Medicine

## 2022-02-03 NOTE — Telephone Encounter (Signed)
Attempted to schedule.  LMOV to call office.  ° °

## 2022-02-03 NOTE — Telephone Encounter (Signed)
Please reschedule office visit for refills. Patient did not show for last scheduled office visit. Thank you!

## 2022-02-19 ENCOUNTER — Other Ambulatory Visit: Payer: Self-pay | Admitting: Family Medicine

## 2022-02-19 DIAGNOSIS — N50812 Left testicular pain: Secondary | ICD-10-CM

## 2022-02-25 ENCOUNTER — Ambulatory Visit
Admission: RE | Admit: 2022-02-25 | Discharge: 2022-02-25 | Disposition: A | Discharge status: Home / Self Care | Payer: Medicare Other | Source: Ambulatory Visit | Attending: Family Medicine | Admitting: Family Medicine

## 2022-02-25 DIAGNOSIS — N50812 Left testicular pain: Secondary | ICD-10-CM

## 2022-03-28 ENCOUNTER — Other Ambulatory Visit: Payer: Self-pay | Admitting: Internal Medicine

## 2022-03-28 NOTE — Telephone Encounter (Signed)
Please contact pt for future appointment. °Pt overdue for 3 month f/u. °Pt needing refills. °

## 2022-04-08 NOTE — Telephone Encounter (Signed)
Please see note below. 

## 2022-05-06 ENCOUNTER — Other Ambulatory Visit: Payer: Self-pay | Admitting: Internal Medicine

## 2022-06-06 ENCOUNTER — Emergency Department (HOSPITAL_COMMUNITY)
Admission: EM | Admit: 2022-06-06 | Discharge: 2022-06-07 | Disposition: A | Discharge status: Home / Self Care | Payer: Medicare Other | Attending: Emergency Medicine | Admitting: Emergency Medicine

## 2022-06-06 ENCOUNTER — Encounter (HOSPITAL_COMMUNITY): Payer: Self-pay

## 2022-06-06 ENCOUNTER — Other Ambulatory Visit: Payer: Self-pay

## 2022-06-06 DIAGNOSIS — R202 Paresthesia of skin: Secondary | ICD-10-CM | POA: Insufficient documentation

## 2022-06-06 DIAGNOSIS — Z794 Long term (current) use of insulin: Secondary | ICD-10-CM | POA: Diagnosis not present

## 2022-06-06 DIAGNOSIS — R911 Solitary pulmonary nodule: Secondary | ICD-10-CM | POA: Diagnosis not present

## 2022-06-06 DIAGNOSIS — R200 Anesthesia of skin: Secondary | ICD-10-CM | POA: Diagnosis present

## 2022-06-06 LAB — COMPREHENSIVE METABOLIC PANEL
ALT: 20 U/L (ref 0–44)
AST: 26 U/L (ref 15–41)
Albumin: 3.7 g/dL (ref 3.5–5.0)
Alkaline Phosphatase: 60 U/L (ref 38–126)
Anion gap: 11 (ref 5–15)
BUN: 21 mg/dL (ref 8–23)
CO2: 23 mmol/L (ref 22–32)
Calcium: 9.3 mg/dL (ref 8.9–10.3)
Chloride: 101 mmol/L (ref 98–111)
Creatinine, Ser: 1.14 mg/dL (ref 0.61–1.24)
GFR, Estimated: >60 mL/min (ref >60)
Glucose, Bld: 231 mg/dL — ABNORMAL HIGH (ref 70–99)
Potassium: 3.4 mmol/L — ABNORMAL LOW (ref 3.5–5.1)
Sodium: 135 mmol/L (ref 135–145)
Total Bilirubin: 1.1 mg/dL (ref 0.3–1.2)
Total Protein: 7.6 g/dL (ref 6.5–8.1)

## 2022-06-06 LAB — CBC WITH DIFFERENTIAL/PLATELET
Abs Immature Granulocytes: 0.01 10*3/uL (ref 0.00–0.07)
Basophils Absolute: 0 10*3/uL (ref 0.0–0.1)
Basophils Relative: 0 %
Eosinophils Absolute: 0.1 10*3/uL (ref 0.0–0.5)
Eosinophils Relative: 2 %
HCT: 46.9 % (ref 39.0–52.0)
Hemoglobin: 14.7 g/dL (ref 13.0–17.0)
Immature Granulocytes: 0 %
Lymphocytes Relative: 35 %
Lymphs Abs: 1.7 10*3/uL (ref 0.7–4.0)
MCH: 22.2 pg — ABNORMAL LOW (ref 26.0–34.0)
MCHC: 31.3 g/dL (ref 30.0–36.0)
MCV: 70.7 fL — ABNORMAL LOW (ref 80.0–100.0)
Monocytes Absolute: 0.6 10*3/uL (ref 0.1–1.0)
Monocytes Relative: 12 %
Neutro Abs: 2.4 10*3/uL (ref 1.7–7.7)
Neutrophils Relative %: 51 %
Platelets: 149 10*3/uL — ABNORMAL LOW (ref 150–400)
RBC: 6.63 MIL/uL — ABNORMAL HIGH (ref 4.22–5.81)
RDW: 17.5 % — ABNORMAL HIGH (ref 11.5–15.5)
WBC: 4.8 10*3/uL (ref 4.0–10.5)
nRBC: 0 % (ref 0.0–0.2)

## 2022-06-06 LAB — URINALYSIS, ROUTINE W REFLEX MICROSCOPIC
Bilirubin Urine: NEGATIVE
Glucose, UA: 50 mg/dL — AB
Hgb urine dipstick: NEGATIVE
Ketones, ur: NEGATIVE mg/dL
Leukocytes,Ua: NEGATIVE
Nitrite: NEGATIVE
Protein, ur: NEGATIVE mg/dL
Specific Gravity, Urine: 1.008 (ref 1.005–1.030)
pH: 5 (ref 5.0–8.0)

## 2022-06-06 LAB — MAGNESIUM: Magnesium: 1.8 mg/dL (ref 1.7–2.4)

## 2022-06-06 NOTE — ED Provider Triage Note (Signed)
Emergency Medicine Provider Triage Evaluation Note  [facility] , a 81 y.o. male  was evaluated in triage.  Pt complains of left arm numbness and muscle spasms.  He states that same has been occurring intermittently over the past 1-2 weeks.  He is currently completely asymptomatic, however states earlier this morning his left arm was numb.  Went to his PCP for same and was found to be hypertensive and there was some concern that he might be having a stroke.  He was told to go to the ER via EMS, however he declined this.  States that when he got home he told his son about what was going on he took him here for further evaluation.  Patient states that on the way here his symptoms completely resolved.  He denies any headaches, vision changes, dizziness, chest pain, shortness of breath, nausea, vomiting.  Review of Systems  Positive:  Negative:   Physical Exam  BP (!) 155/109 (BP Location: Right Arm)   Pulse 63   Temp 98.3 F (36.8 C) (Oral)   Resp 16   SpO2 94%  Gen:   Awake, no distress   Resp:  Normal effort  MSK:   Moves extremities without difficulty  Other:  Alert and oriented and neurologically intact without focal deficits  Medical Decision Making  Medically screening exam initiated at 7:41 PM.  Appropriate orders placed.  Carmino Tuckerman was informed that the remainder of the evaluation will be completed by another provider, this initial triage assessment does not replace that evaluation, and the importance of remaining in the ED until their evaluation is complete.     Vear Clock 06/06/22 1946

## 2022-06-06 NOTE — ED Triage Notes (Signed)
Pt reports intermittent left sided arm numbness and spasms x1 week. Pt reports going to a doctors office earlier this week and they stated he might be having "mini strokes". Pt denies any numbness, spasm, or pain at this time.

## 2022-06-07 ENCOUNTER — Emergency Department (HOSPITAL_COMMUNITY): Payer: Medicare Other

## 2022-06-07 NOTE — ED Provider Notes (Signed)
[facility]-EMERGENCY DEPT Provider Note   CSN: [id number] Arrival date & time: 06/06/22  1833     History  Chief Complaint  Patient presents with   Numbness    Ronald Ramirez is a 81 y.o. male.  Patient presents to the emergency department for evaluation of numbness of the left arm.  Patient has been having intermittent episodes for approximately 2 weeks.  Patient reports that when it occurs he feels numb from the armpit to the wrist.  It usually lasts for 2 or 3 minutes and then resolves.  No headache, vision change.  He has not had any leg involvement.  He has had some intermittent episodes of pain at the left side of his neck.  Patient reports that he saw his doctor and was told he might be having "mini strokes".  He reports that no referrals or further work-up was performed.       Home Medications Prior to Admission medications   Medication Sig Start Date End Date Taking? Authorizing Provider  atorvastatin (LIPITOR) 10 MG tablet Take 10 mg by mouth at bedtime. 04/30/22  Yes [provider]  BELSOMRA 10 MG TABS Take 10 mg by mouth at bedtime. 04/30/22  Yes [provider]  clonazePAM (KLONOPIN) 1 MG tablet Take 1 mg by mouth 2 (two) times daily. 08/14/21  Yes [provider]  hydrochlorothiazide (HYDRODIURIL) 25 MG tablet Take 1 tablet (25 mg total) by mouth daily. PLEASE SCHEDULE OFFICE VISIT FOR FURTHER REFILLS. THANK YOU! 02/13/22  Yes End, Cristal Deer, MD  insulin NPH-regular Human (70-30) 100 UNIT/ML injection Inject 25 Units into the skin 2 (two) times daily with a meal.   Yes [provider]  losartan (COZAAR) 100 MG tablet TAKE 1 TABLET BY MOUTH EVERY DAY Patient taking differently: Take 100 mg by mouth daily. 05/06/22  Yes End, Cristal Deer, MD  metoprolol tartrate (LOPRESSOR) 100 MG tablet Take 100 mg by mouth 2 (two) times daily. 04/25/22  Yes [provider]      Allergies    Lantus [insulin glargine] and  Metformin and related    Review of Systems   Review of Systems  Physical Exam Updated Vital Signs BP (!) 170/110   Pulse (!) 59   Temp 98.3 F (36.8 C) (Oral)   Resp 18   SpO2 99%  Physical Exam Vitals and nursing note reviewed.  Constitutional:      General: He is not in acute distress.    Appearance: He is well-developed.  HENT:     Head: Normocephalic and atraumatic.     Mouth/Throat:     Mouth: Mucous membranes are moist.  Eyes:     General: Vision grossly intact. Gaze aligned appropriately.     Extraocular Movements: Extraocular movements intact.     Conjunctiva/sclera: Conjunctivae normal.  Cardiovascular:     Rate and Rhythm: Normal rate and regular rhythm.     Pulses: Normal pulses.     Heart sounds: Normal heart sounds, S1 normal and S2 normal. No murmur heard.    No friction rub. No gallop.  Pulmonary:     Effort: Pulmonary effort is normal. No respiratory distress.     Breath sounds: Normal breath sounds.  Abdominal:     Palpations: Abdomen is soft.     Tenderness: There is no abdominal tenderness. There is no guarding or rebound.     Hernia: No hernia is present.  Musculoskeletal:        General: No swelling.  Cervical back: Full passive range of motion without pain, normal range of motion and neck supple. No pain with movement, spinous process tenderness or muscular tenderness. Normal range of motion.     Right lower leg: No edema.     Left lower leg: No edema.  Skin:    General: Skin is warm and dry.     Capillary Refill: Capillary refill takes less than 2 seconds.     Findings: No ecchymosis, erythema, lesion or wound.  Neurological:     Mental Status: He is alert and oriented to person, place, and time.     GCS: GCS eye subscore is 4. GCS verbal subscore is 5. GCS motor subscore is 6.     Cranial Nerves: Cranial nerves 2-12 are intact.     Sensory: Sensation is intact.     Motor: Motor function is intact. No weakness or abnormal muscle tone.      Coordination: Coordination is intact.     Comments: Normal strength and sensation bilateral extremities, 5 out of 5 strength in all 4 extremities.  Psychiatric:        Mood and Affect: Mood normal.        Speech: Speech normal.        Behavior: Behavior normal.     ED Results / Procedures / Treatments   Labs (all labs ordered are listed, but only abnormal results are displayed) Labs Reviewed  CBC WITH DIFFERENTIAL/PLATELET - Abnormal; Notable for the following components:      Result Value   RBC 6.63 (*)    MCV 70.7 (*)    MCH 22.2 (*)    RDW 17.5 (*)    Platelets 149 (*)    All other components within normal limits  COMPREHENSIVE METABOLIC PANEL - Abnormal; Notable for the following components:   Potassium 3.4 (*)    Glucose, Bld 231 (*)    All other components within normal limits  URINALYSIS, ROUTINE W REFLEX MICROSCOPIC - Abnormal; Notable for the following components:   Color, Urine STRAW (*)    Glucose, UA 50 (*)    All other components within normal limits  MAGNESIUM    EKG None  Radiology CT HEAD WO CONTRAST ( )  Result Date: 06/07/2022 CLINICAL DATA:  Neuro deficit, acute, stroke suspected; Cervical radiculopathy, no red flags EXAM: CT HEAD WITHOUT CONTRAST CT CERVICAL SPINE WITHOUT CONTRAST TECHNIQUE: Multidetector CT imaging of the head and cervical spine was performed following the standard protocol without intravenous contrast. Multiplanar CT image reconstructions of the cervical spine were also generated. RADIATION DOSE REDUCTION: This exam was performed according to the departmental dose-optimization program which includes automated exposure control, adjustment of the mA and/or kV according to patient size and/or use of iterative reconstruction technique. COMPARISON:  None Available. FINDINGS: CT HEAD FINDINGS Brain: Normal anatomic configuration. No abnormal intra or extra-axial mass lesion or fluid collection. No abnormal mass effect or midline shift. No  evidence of acute intracranial hemorrhage or infarct. Ventricular size is normal. Cerebellum unremarkable. Vascular: Unremarkable Skull: Intact Sinuses/Orbits: Paranasal sinuses are clear. Orbits are unremarkable. Other: Mastoid air cells and middle ear cavities are clear. CT CERVICAL SPINE FINDINGS Alignment: Normal. Skull base and vertebrae: Craniocervical alignment is normal. The atlantodental interval is not widened. No acute fracture of the cervical spine. Vertebral body height is preserved. Soft tissues and spinal canal: No prevertebral fluid or swelling. No visible canal hematoma. Disc levels: There is intervertebral disc space narrowing and endplate remodeling at C5-6 and to a  lesser extent C6-7 and C7-T1 in keeping with changes of degenerative disc disease, severe at C5-6. Prevertebral soft tissues are not thickened. 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 Superimposed multilevel uncovertebral and facet arthrosis results in multilevel moderate to severe neuroforaminal narrowing, most severe on the left at C4-5 and C5-6 Upper chest: 8 mm spiculated pulmonary nodule is seen within the right apex at axial image # 86/3, indeterminate. Other: None IMPRESSION: 1. No acute intracranial abnormality. No calvarial fracture. 2. No acute fracture or listhesis of the cervical spine. 3. 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 4. 8 mm spiculated pulmonary nodule within the right apex, indeterminate. If indicated, this would be better assessed with dedicated nonemergent CT examination of the chest. Electronically Signed   By: Helyn Numbers M.D.   On: 06/07/2022 00:47   CT CERVICAL SPINE WO CONTRAST  Result Date: 06/07/2022 CLINICAL DATA:  Neuro deficit, acute, stroke suspected; Cervical radiculopathy, no red flags EXAM: CT HEAD WITHOUT  CONTRAST CT CERVICAL SPINE WITHOUT CONTRAST TECHNIQUE: Multidetector CT imaging of the head and cervical spine was performed following the standard protocol without intravenous contrast. Multiplanar CT image reconstructions of the cervical spine were also generated. RADIATION DOSE REDUCTION: This exam was performed according to the departmental dose-optimization program which includes automated exposure control, adjustment of the mA and/or kV according to patient size and/or use of iterative reconstruction technique. COMPARISON:  None Available. FINDINGS: CT HEAD FINDINGS Brain: Normal anatomic configuration. No abnormal intra or extra-axial mass lesion or fluid collection. No abnormal mass effect or midline shift. No evidence of acute intracranial hemorrhage or infarct. Ventricular size is normal. Cerebellum unremarkable. Vascular: Unremarkable Skull: Intact Sinuses/Orbits: Paranasal sinuses are clear. Orbits are unremarkable. Other: Mastoid air cells and middle ear cavities are clear. CT CERVICAL SPINE FINDINGS Alignment: Normal. Skull base and vertebrae: Craniocervical alignment is normal. The atlantodental interval is not widened. No acute fracture of the cervical spine. Vertebral body height is preserved. Soft tissues and spinal canal: No prevertebral fluid or swelling. No visible canal hematoma. 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 Prevertebral soft tissues are not thickened. 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 Superimposed multilevel uncovertebral and facet arthrosis results in multilevel moderate to severe neuroforaminal narrowing, most severe on the left at C4-5 and C5-6 Upper chest: 8 mm spiculated pulmonary nodule is seen within the right apex at axial image # 86/3,  indeterminate. Other: None IMPRESSION: 1. No acute intracranial abnormality. No calvarial fracture. 2. No acute fracture or listhesis of the cervical spine. 3. 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 4. 8 mm spiculated pulmonary nodule within the right apex, indeterminate. If indicated, this would be better assessed with dedicated nonemergent CT examination of the chest. Electronically Signed   By: Helyn Numbers M.D.   On: 06/07/2022 00:47    Procedures Procedures    Medications Ordered in ED Medications - No data to display  ED Course/ Medical Decision Making/ A&P  Medical Decision Making Amount and/or Complexity of Data Reviewed Radiology: ordered.   Patient presents to the emergency department for evaluation of intermittent episodes of left arm numbness.  Symptoms occur intermittently, not related to exertion.  No associated chest pain or cardiac symptoms.  Patient reports a numbness that occurs from the axilla to the wrist, usually not involving the hand or fingers.  It only last for a couple of minutes and then resolves.  It does happen multiple times per day.  No leg involvement.  No speech disturbance or facial symptoms.  This seems unlikely to be related to TIA or stroke.  He is currently not experiencing symptoms, does not have any neurologic findings on exam.  Head CT negative.  I did perform a CT of his cervical spine and there is multilevel changes present.  Symptoms could be more consistent with cervical radiculopathy than CNS involvement.  Patient did have a spiculated nodule noted on apex of lung on his cervical spine CT.  Will refer to pulmonology for further work-up.  At this point I do not feel the patient requires any further work-up.  I feel that this is unlikely to be related to a TIA but will refer to neurology for further work-up and  opinion.  No red flags to suggest any acute neurosurgical emergency based on C-spine findings.        Final Clinical Impression(s) / ED Diagnoses Final diagnoses:  Paresthesia  Lung nodule    Rx / DC Orders ED Discharge Orders          Ordered    Ambulatory referral to Pulmonology        06/07/22 0316    Ambulatory referral to Neurology       Comments: An appointment is requested in approximately: 2 weeks   06/07/22 0316              Gilda Crease, MD 06/07/22 [phone]

## 2022-06-25 ENCOUNTER — Ambulatory Visit: Payer: Medicare Other | Admitting: Internal Medicine

## 2022-06-25 NOTE — Progress Notes (Deleted)
Follow-up Outpatient Visit Date: 06/25/2022  Primary Care Provider: [facility] @ [address] [place] [address]  Chief Complaint: ***  HPI:  Ronald Ramirez is a 81 y.o. male with history of hypertension, hyperlipidemia, type 2 diabetes mellitus, and anxiety, who presents for follow-up of chest pain.  I last saw him in 09/2021, at which time he was feeling fairly well though he noted intermittent palpitations and vague soreness in the chest.  Overall his symptoms were stable from prior visits.  He complained of some coolness in his feet; ABIs were normal though there was concern for possible disease involving the posterior tibial arteries bilaterally.  --------------------------------------------------------------------------------------------------  Cardiovascular History & Procedures: Cardiovascular Problems: Palpitations Chest pain   Risk Factors: Hypertension, hyperlipidemia, diabetes mellitus, male gender, and age greater than 8   Cath/PCI: None   CV Surgery: None   EP Procedures and Devices: 30-day event monitor (09/01/17): Sinus rhythm with PVCs, including ventricular trigeminy.  Patient reported symptoms correspond to PVCs.   Non-Invasive Evaluation(s): ABIs (11/01/2021): Normal bilaterally ABIs (0.97 on the right and 1.1 on the left).  Normal TBI's (0.73 on the right and 0.77 on the left).  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 Pharmacologic MPI (07/12/2021): Low risk study without evidence of ischemia or scar.  LVEF 63%. Pharmacologic MPI (10/28/17): Low risk study with small in size, mild in severity fixed inferolateral defect that may represent scar or artifact. LVEF 53%. TTE (09/01/17): Normal LV size with moderate focal basal hypertrophy of the septum.  LVEF 55-60% with normal regional wall motion.  Grade 1 diastolic dysfunction.  Trileaflet, mildly thickened, mildly calcified aortic valve with trivial  regurgitation.  Normal RV size and function.  Recent CV Pertinent Labs: Lab Results  Component Value Date   CHOL 96 (L) 06/01/2018   HDL 38 (L) 06/01/2018   LDLCALC 38 06/01/2018   TRIG 101 06/01/2018   CHOLHDL 2.5 06/01/2018   K 3.4 (L) 06/06/2022   MG 1.8 06/06/2022   BUN 21 06/06/2022   BUN 14 04/16/2018   CREATININE 1.14 06/06/2022    Past medical and surgical history were reviewed and updated in EPIC.  No outpatient medications have been marked as taking for the 06/25/22 encounter (Appointment) with Micheala Morissette, Cristal Deer, MD.    Allergies: Lantus [insulin glargine] and Metformin and related  Social History   Tobacco Use   Smoking status: Never   Smokeless tobacco: Never  Vaping Use   Vaping Use: Never used  Substance Use Topics   Alcohol use: No   Drug use: No    Family History  Problem Relation Age of Onset   Bleeding Disorder Sister     Review of Systems: A 12-system review of systems was performed and was negative except as noted in the HPI.  --------------------------------------------------------------------------------------------------  Physical Exam: There were no vitals taken for this visit.  General:  NAD. Neck: No JVD or HJR. Lungs: Clear to auscultation bilaterally without wheezes or crackles. Heart: Regular rate and rhythm without murmurs, rubs, or gallops. Abdomen: Soft, nontender, nondistended. Extremities: No lower extremity edema.  EKG:  ***  Lab Results  Component Value Date   WBC 4.8 06/06/2022   HGB 14.7 06/06/2022   HCT 46.9 06/06/2022   MCV 70.7 (L) 06/06/2022   PLT 149 (L) 06/06/2022    Lab Results  Component Value Date   NA 135 06/06/2022   K 3.4 (L) 06/06/2022   CL 101 06/06/2022   CO2 23 06/06/2022  BUN 21 06/06/2022   CREATININE 1.14 06/06/2022   GLUCOSE 231 (H) 06/06/2022   ALT 20 06/06/2022    Lab Results  Component Value Date   CHOL 96 (L) 06/01/2018   HDL 38 (L) 06/01/2018   LDLCALC 38 06/01/2018   TRIG  101 06/01/2018   CHOLHDL 2.5 06/01/2018    --------------------------------------------------------------------------------------------------  ASSESSMENT AND PLAN: Cristal Deer Alexya Mcdaris, MD 06/25/2022 1:05 PM

## 2022-06-27 ENCOUNTER — Ambulatory Visit: Payer: Medicare Other | Admitting: Neurology

## 2022-06-27 ENCOUNTER — Encounter: Payer: Self-pay | Admitting: Neurology

## 2022-06-27 VITALS — BP 143/95 | HR 86 | Ht 72.0 in | Wt 200.0 lb

## 2022-06-27 DIAGNOSIS — M5412 Radiculopathy, cervical region: Secondary | ICD-10-CM

## 2022-06-27 MED ORDER — GABAPENTIN 100 MG PO CAPS: 100.0000 mg | ORAL_CAPSULE | Freq: Two times a day (BID) | ORAL | 3 refills | Status: DC

## 2022-06-27 NOTE — Progress Notes (Signed)
[facility]  PATIENT: Ronald Ramirez DOB: 11/15/1941  REQUESTING CLINICIAN: Gilda Crease,* HISTORY FROM: Patient  REASON FOR VISIT: Left arm numbness    HISTORICAL  CHIEF COMPLAINT:  Chief Complaint  Patient presents with   Rm 12    Here alone for numbness in L arm. He went to the ER. They had him follow-up with neurology. He denies any numbness right now, but it still comes and goes.    HISTORY OF PRESENT ILLNESS:  This is a 81 year old gentleman with past medical history of hypertension, hyperlipidemia, diabetes mellitus who is presenting with history of left arm numbness, intermittent, starting from the shoulder and travels down the left arm.  He reports the episode last about 2 to 3 minutes and subside.  He went to the emergency department on June 17, work-up showed multilevel degenerative disc disease and multilevel foraminal narrowing most severe at the left C4-5 and C5-C6.  He denies any falls, denies any previous trauma to the head or neck.  He has not tried any other medication for the numbness.  Denies any weakness of the left arm. States that today, he is not symptomatic.   OTHER MEDICAL CONDITIONS: Hypertension, hyperlipidemia, Diabetes Mellitus    REVIEW OF SYSTEMS: Full 14 system review of systems performed and negative with exception of: as noted in the HPI   ALLERGIES: Allergies  Allergen Reactions   Lantus [Insulin Glargine] Itching   Metformin And Related Diarrhea    HOME MEDICATIONS: Outpatient Medications Prior to Visit  Medication Sig Dispense Refill   amoxicillin-clavulanate (AUGMENTIN) 875-125 MG tablet SMARTSIG:1 Tablet(s) By Mouth Every 12 Hours     clonazePAM (KLONOPIN) 1 MG tablet Take 1 mg by mouth daily.     DM-APAP-CPM (CORICIDIN HBP PO) Take by mouth as needed.     hydrochlorothiazide (HYDRODIURIL) 25 MG tablet Take 1 tablet (25 mg total) by mouth daily. PLEASE SCHEDULE OFFICE VISIT FOR FURTHER REFILLS. THANK  YOU! 30 tablet 0   insulin NPH-regular Human (70-30) 100 UNIT/ML injection Inject 25 Units into the skin 2 (two) times daily with a meal.     losartan (COZAAR) 100 MG tablet TAKE 1 TABLET BY MOUTH EVERY DAY (Patient taking differently: Take 100 mg by mouth daily.) 90 tablet 3   metoprolol tartrate (LOPRESSOR) 100 MG tablet Take 100 mg by mouth 2 (two) times daily.     atorvastatin (LIPITOR) 10 MG tablet Take 10 mg by mouth at bedtime. (Patient not taking: Reported on 06/27/2022)     BELSOMRA 10 MG TABS Take 10 mg by mouth at bedtime. (Patient not taking: Reported on 06/27/2022)     No facility-administered medications prior to visit.    PAST MEDICAL HISTORY: Past Medical History:  Diagnosis Date   Anxiety    Chest pain    NUC stress 11/09- overall low risk, no ishemia, normal EF, exercise time 5:01, NSSSTW changes   Diabetes mellitus without complication (HCC)    Hypertension    Occasional tremors    PVC's (premature ventricular contractions)     PAST SURGICAL HISTORY: Past Surgical History:  Procedure Laterality Date   HERNIA REPAIR      FAMILY HISTORY: Family History  Problem Relation Age of Onset   Diabetes Sister    Bleeding Disorder Sister    Stroke Son     SOCIAL HISTORY: Social History   Socioeconomic History   Marital status: Widowed    Spouse name: Not on file   Number of children: Not on  file   Years of education: Not on file   Highest education level: Not on file  Occupational History   Not on file  Tobacco Use   Smoking status: Never   Smokeless tobacco: Never  Vaping Use   Vaping Use: Never used  Substance and Sexual Activity   Alcohol use: No   Drug use: No   Sexual activity: Not on file  Other Topics Concern   Not on file  Social History Narrative   Lives alone    Right handed   Caffeine: none    Social Determinants of Health   Financial Resource Strain: Not on file  Food Insecurity: Not on file  Transportation Needs: Not on file   Physical Activity: Not on file  Stress: Not on file  Social Connections: Not on file  Intimate Partner Violence: Not on file    PHYSICAL EXAM  GENERAL EXAM/CONSTITUTIONAL: Vitals:  Vitals:   06/27/22 1114  BP: (!) 143/95  Pulse: 86  Weight: 200 lb (90.7 kg)  Height: 6' (1.829 m)   Body mass index is 27.12 kg/m. Wt Readings from Last 3 Encounters:  06/27/22 200 lb (90.7 kg)  09/25/21 198 lb (89.8 kg)  07/12/21 198 lb (89.8 kg)   Patient is in no distress; well developed, nourished and groomed; neck is supple  EYES: Pupils round and reactive to light, Visual fields full to confrontation, Extraocular movements intacts,   MUSCULOSKELETAL: Gait, strength, tone, movements noted in Neurologic exam below  NEUROLOGIC: MENTAL STATUS:      No data to display         awake, alert, oriented to person, place and time recent and remote memory intact normal attention and concentration language fluent, comprehension intact, naming intact fund of knowledge appropriate  CRANIAL NERVE:  2nd, 3rd, 4th, 6th - pupils equal and reactive to light, visual fields full to confrontation, extraocular muscles intact, no nystagmus 5th - facial sensation symmetric 7th - facial strength symmetric 8th - hearing intact 9th - palate elevates symmetrically, uvula midline 11th - shoulder shrug symmetric 12th - tongue protrusion midline  MOTOR:  normal bulk and tone, full strength in the BUE, BLE  SENSORY:  normal and symmetric to light touch, pinprick, vibration  COORDINATION:  finger-nose-finger, fine finger movements normal  REFLEXES:  deep tendon reflexes present and symmetric  GAIT/STATION:  normal   DIAGNOSTIC DATA (LABS, IMAGING, TESTING) - I reviewed patient records, labs, notes, testing and imaging myself where available.  Lab Results  Component Value Date   WBC 4.8 06/06/2022   HGB 14.7 06/06/2022   HCT 46.9 06/06/2022   MCV 70.7 (L) 06/06/2022   PLT 149 (L)  06/06/2022      Component Value Date/Time   NA 135 06/06/2022 1955   NA 136 04/16/2018 1032   K 3.4 (L) 06/06/2022 1955   CL 101 06/06/2022 1955   CO2 23 06/06/2022 1955   GLUCOSE 231 (H) 06/06/2022 1955   BUN 21 06/06/2022 1955   BUN 14 04/16/2018 1032   CREATININE 1.14 06/06/2022 1955   CALCIUM 9.3 06/06/2022 1955   PROT 7.6 06/06/2022 1955   PROT 7.2 04/16/2018 1032   ALBUMIN 3.7 06/06/2022 1955   ALBUMIN 3.8 04/16/2018 1032   AST 26 06/06/2022 1955   ALT 20 06/06/2022 1955   ALKPHOS 60 06/06/2022 1955   BILITOT 1.1 06/06/2022 1955   BILITOT 1.0 04/16/2018 1032   GFRNONAA >60 06/06/2022 1955   GFRAA 61 04/16/2018 1032   Lab Results  Component Value Date   CHOL 96 (L) 06/01/2018   HDL 38 (L) 06/01/2018   LDLCALC 38 06/01/2018   TRIG 101 06/01/2018   CHOLHDL 2.5 06/01/2018   No results found for: "HGBA1C" No results found for: "VITAMINB12" No results found for: "TSH"   CT Head/Cervical spine 06/07/2022 1. No acute intracranial abnormality. No calvarial fracture. 2. No acute fracture or listhesis of the cervical spine. 3. 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    ASSESSMENT AND PLAN  81 y.o. year old male with past medical history of hypertension, hyperlipidemia, diabetes mellitus who is presenting with complaint of intermittent left shoulder, left arm numbness lasting about 2 to 3 minutes.  Work-up so far revealed multilevel disc and joint disease with foraminal stenosis most severe on the left.  Today he is not symptomatic, his sensation is normal and his strength is normal.  Patient likely have cervical radiculopathy that is causing the numbness.  He is also complaining of intermittent headaches.  His head CT was also normal.  I will start him on low-dose gabapentin 100 mg twice a day.  Advised him to continue follow up with his PCP and if the symptoms get  worse,, including weakness, we will consider referral physical therapy.  He is comfortable with plans.  Return as needed.   1. Cervical radiculopathy      Patient Instructions  Start with Gabapentin 100 mg twice daily  Continue your other medications  Follow with your primary care physician   No orders of the defined types were placed in this encounter.   Meds ordered this encounter  Medications   gabapentin (NEURONTIN) 100 MG capsule    Sig: Take 1 capsule (100 mg total) by mouth 2 (two) times daily.    Dispense:  60 capsule    Refill:  3    Return if symptoms worsen or fail to improve.  I have spent a total of 45 minutes dedicated to this patient today, preparing to see patient, performing a medically appropriate examination and evaluation, ordering tests and/or medications and procedures, and counseling and educating the patient/family/caregiver; independently interpreting result and communicating results to the family/patient/caregiver; and documenting clinical information in the electronic medical record.   Windell Norfolk, MD 06/27/2022, 11:45 AM  [facility] [address], [address] [place], [place] [address] [phone]

## 2022-06-27 NOTE — Patient Instructions (Addendum)
Start with Gabapentin 100 mg twice daily  Continue your other medications  Follow with your primary care physician

## 2022-07-03 ENCOUNTER — Ambulatory Visit: Payer: Medicare Other | Admitting: Emergency Medicine

## 2022-07-03 ENCOUNTER — Encounter: Payer: Self-pay | Admitting: Emergency Medicine

## 2022-07-03 DIAGNOSIS — R911 Solitary pulmonary nodule: Secondary | ICD-10-CM | POA: Diagnosis not present

## 2022-07-03 NOTE — Progress Notes (Signed)
Subjective:    Patient ID: Ronald Ramirez, male    DOB: 10/23/1941, 81 y.o.   MRN: [id number]  HPI [name] is 81 years old, never smoker with a history of diabetes, hyperlipidemia, hypertension, PVC's.  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  Imaging has revealed multilevel DDD most severe at the left C4-5, C5-6.  Also showed a spuriously identified right upper lobe nodule as below.  He is referred today for evaluation of that nodule. No hx CA. He does cough daily, never hemoptysis.   Head CT 06/07/2022 reviewed by me showed no intracranial abnormality CT scan cervical spine 06/07/2022 showed degenerative disc disease as well as a newly identified right upper lobe 8 mm spiculated pulmonary nodule.   Review of Systems As per HPI  Past Medical History:  Diagnosis Date   Anxiety    Chest pain    NUC stress 11/09- overall low risk, no ishemia, normal EF, exercise time 5:01, NSSSTW changes   Diabetes mellitus without complication (HCC)    Hypertension    Occasional tremors    PVC's (premature ventricular contractions)   Hyperlipidemia   Family History  Problem Relation Age of Onset   Diabetes Sister    Bleeding Disorder Sister    Stroke Son     No family hx lung CA.   Social History   Socioeconomic History   Marital status: Widowed    Spouse name: Not on file   Number of children: Not on file   Years of education: Not on file   Highest education level: Not on file  Occupational History   Not on file  Tobacco Use   Smoking status: Never   Smokeless tobacco: Never  Vaping Use   Vaping Use: Never used  Substance and Sexual Activity   Alcohol use: No   Drug use: No   Sexual activity: Not on file  Other Topics Concern   Not on file  Social History Narrative   Lives alone    Right handed   Caffeine: none    Social Determinants of Health   Financial Resource Strain:  Not on file  Food Insecurity: Not on file  Transportation Needs: Not on file  Physical Activity: Not on file  Stress: Not on file  Social Connections: Not on file  Intimate Partner Violence: Not on file    Has worked [employer], Sports coach, 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     DM-APAP-CPM (CORICIDIN HBP PO) Take by mouth as needed.     gabapentin (NEURONTIN) 100 MG capsule Take 1 capsule (100 mg total) by mouth 2 (two) times daily. 60 capsule 3   hydrochlorothiazide (HYDRODIURIL) 25 MG tablet Take 1 tablet (25 mg total) by mouth daily. PLEASE SCHEDULE OFFICE VISIT FOR FURTHER REFILLS. THANK YOU! 30 tablet 0   insulin NPH-regular Human (70-30) 100 UNIT/ML injection Inject 25 Units into the skin 2 (two) times daily with a meal.     losartan (COZAAR) 100 MG tablet TAKE 1 TABLET BY MOUTH EVERY DAY (Patient taking differently: Take 100 mg by mouth daily.) 90 tablet 3   metoprolol tartrate (LOPRESSOR) 100 MG tablet Take 100 mg by mouth  2 (two) times daily.     amoxicillin-clavulanate (AUGMENTIN) 875-125 MG tablet SMARTSIG:1 Tablet(s) By Mouth Every 12 Hours     atorvastatin (LIPITOR) 10 MG tablet Take 10 mg by mouth at bedtime. (Patient not taking: Reported on 06/27/2022)     BELSOMRA 10 MG TABS Take 10 mg by mouth at bedtime. (Patient not taking: Reported on 06/27/2022)     No facility-administered medications prior to visit.        Objective:   Physical Exam  Vitals:   07/03/22 1514  BP: 128/86  Pulse: 95  SpO2: 99%  Weight: 202 lb 3.2 oz (91.7 kg)  Height: 6' (1.829 m)    Gen: Pleasant, well-nourished, in no distress,  normal affect  ENT: No lesions,  mouth clear,  oropharynx clear, no postnasal drip  Neck: No JVD, no  stridor  Lungs: No use of accessory muscles, no crackles or wheezing on normal respiration, no wheeze on forced expiration  Cardiovascular: RRR, heart sounds normal, no murmur or gallops, no peripheral edema  Musculoskeletal: No deformities, no cyanosis or clubbing  Neuro: alert, awake, non focal  Skin: Warm, no lesions or rash     Assessment & Plan:   Pulmonary nodule 8 mm right upper lobe pulmonary nodule identified spuriously on CT scan of the C-spine.  Unclear significance but will need to be better characterized and then followed.  We will start with a CT scan of the chest with contrast.  He will need serial imaging.  If the nodule enlarges then we will arrange for diagnostics, probably either navigational bronchoscopy or could consider referral for primary resection.  We reviewed your CT scan of the cervical spine today.  It shows a small right upper lobe pulmonary nodule. We will arrange for a dedicated CT scan of your chest to further evaluate the nodule. Follow-up with Dr. Delton Coombes next available after the CT scan so we can review the results together.   Levy Pupa, MD, PhD 07/03/2022, 3:55 PM [facility] [phone] or if no answer before 7:00PM call [phone] For any issues after 7:00PM please call eLink [phone]

## 2022-07-03 NOTE — Assessment & Plan Note (Signed)
8 mm right upper lobe pulmonary nodule identified spuriously on CT scan of the C-spine.  Unclear significance but will need to be better characterized and then followed.  We will start with a CT scan of the chest with contrast.  He will need serial imaging.  If the nodule enlarges then we will arrange for diagnostics, probably either navigational bronchoscopy or could consider referral for primary resection.  We reviewed your CT scan of the cervical spine today.  It shows a small right upper lobe pulmonary nodule. We will arrange for a dedicated CT scan of your chest to further evaluate the nodule. Follow-up with Dr. Delton Coombes next available after the CT scan so we can review the results together.

## 2022-07-03 NOTE — Addendum Note (Signed)
Addended by: Maurene Capes on: 07/03/2022 05:09 PM   Modules accepted: Orders

## 2022-07-03 NOTE — Patient Instructions (Signed)
We reviewed your CT scan of the cervical spine today.  It shows a small right upper lobe pulmonary nodule. We will arrange for a dedicated CT scan of your chest to further evaluate the nodule. Follow-up with Dr. Delton Coombes next available after the CT scan so we can review the results together.

## 2022-07-15 ENCOUNTER — Ambulatory Visit (HOSPITAL_COMMUNITY)
Admission: RE | Admit: 2022-07-15 | Discharge: 2022-07-15 | Disposition: A | Discharge status: Home / Self Care | Payer: Medicare Other | Source: Ambulatory Visit | Attending: Emergency Medicine | Admitting: Emergency Medicine

## 2022-07-15 DIAGNOSIS — R911 Solitary pulmonary nodule: Secondary | ICD-10-CM | POA: Diagnosis present

## 2022-07-15 MED ORDER — IOHEXOL 300 MG/ML  SOLN
75.0000 mL | Freq: Once | INTRAMUSCULAR | Status: AC | PRN
  Administered 2022-07-15: 75 mL via INTRAVENOUS

## 2022-07-15 MED ORDER — SODIUM CHLORIDE (PF) 0.9 % IJ SOLN
INTRAMUSCULAR | Status: AC
  Filled 2022-07-15: qty 50

## 2022-07-24 ENCOUNTER — Other Ambulatory Visit: Payer: Self-pay

## 2022-07-24 DIAGNOSIS — R911 Solitary pulmonary nodule: Secondary | ICD-10-CM

## 2022-08-12 ENCOUNTER — Ambulatory Visit: Payer: Medicare Other | Admitting: Medical

## 2022-08-12 ENCOUNTER — Encounter: Payer: Self-pay | Admitting: Medical

## 2022-08-12 VITALS — BP 146/92 | Ht 72.0 in | Wt 195.0 lb

## 2022-08-12 DIAGNOSIS — I10 Essential (primary) hypertension: Secondary | ICD-10-CM | POA: Diagnosis not present

## 2022-08-12 DIAGNOSIS — R0789 Other chest pain: Secondary | ICD-10-CM

## 2022-08-12 MED ORDER — ISOSORBIDE MONONITRATE ER 30 MG PO TB24
15.0000 mg | ORAL_TABLET | Freq: Every day | ORAL | 3 refills | Status: DC
Start: 2022-08-12 — End: 2024-01-06

## 2022-08-12 NOTE — Patient Instructions (Signed)
Medication Instructions:  DECREASE METOPROLOL TO 50 MG TWICE DAILY START  ISOSORBIDE 30 MG 1/2 TAB EVERY DAY *If you need a refill on your cardiac medications before your next appointment, please call your pharmacy*   Lab Work: NONE If you have labs (blood work) drawn today and your tests are completely normal, you will receive your results only by: MyChart Message (if you have MyChart) OR A paper copy in the mail If you have any lab test that is abnormal or we need to change your treatment, we will call you to review the results.   Testing/Procedures: NONE   Follow-Up: At [facility], you and your health needs are our priority.  As part of our continuing mission to provide you with exceptional heart care, we have created designated Provider Care Teams.  These Care Teams include your primary Cardiologist (physician) and Advanced Practice Providers (APPs -  Physician Assistants and Nurse Practitioners) who all work together to provide you with the care you need, when you need it.  We recommend signing up for the patient portal called "MyChart".  Sign up information is provided on this After Visit Summary.  MyChart is used to connect with patients for Virtual Visits (Telemedicine).  Patients are able to view lab/test results, encounter notes, upcoming appointments, etc.  Non-urgent messages can be sent to your provider as well.   To learn more about what you can do with MyChart, go to [email or web address].    Your next appointment:   3 month(s)  The format for your next appointment:   In Person  Provider:   You will see one of the following Advanced Practice Providers on your designated Care Team:    Cadence Fransico Michael, [place]      Other Instructions NONE  Important Information About Sugar

## 2022-08-12 NOTE — Progress Notes (Unsigned)
Cardiology Office Note:    Date:  08/12/2022   ID:  Ronald Ramirez, DOB [birth year], MRN [id number]  PCP:  [name], MD  [facility] Cardiologist:  Yvonne Kendall, MD  [facility] Electrophysiologist:  None   Referring MD: Darrin Nipper Family M*   Chief Complaint: 3 month Follow-up  History of Present Illness:    Ronald Ramirez is a 81 y.o. male with a hx of  hypertension, hyperlipidemia, type 2 diabetes mellitus, and anxiety who presents for 3 month follow-up.  Stress test July 2022 that was low risk and without ischemia or scar.   Last seen 09/2021 and reported atypical chest pain and cold feet. ABIs were ordered. This showed normal right ABI and normal left ABI with possible tibial vessel disease.   Today, the patient reports has been doing OK. He has occasional chest pain, started 3-4 months ago. It has not been getting worse. Chest pain occurs at rest and lasts a couple minutes. Breathing is the same. No LLE, orthopnea, pnd. BP is a little high today.  He did have medications today.   Past Medical History:  Diagnosis Date   Anxiety    Chest pain    NUC stress 11/09- overall low risk, no ishemia, normal EF, exercise time 5:01, NSSSTW changes   Diabetes mellitus without complication (HCC)    Hypertension    Occasional tremors    PVC's (premature ventricular contractions)     Past Surgical History:  Procedure Laterality Date   HERNIA REPAIR      Current Medications: Current Meds  Medication Sig   isosorbide mononitrate (IMDUR) 30 MG 24 hr tablet Take 0.5 tablets (15 mg total) by mouth daily.     Allergies:   Lantus [insulin glargine] and Metformin and related   Social History   Socioeconomic History   Marital status: Widowed    Spouse name: Not on file   Number of children: Not on file   Years of education: Not on file   Highest education level: Not on file  Occupational History   Not on file  Tobacco Use   Smoking status: Never    Smokeless tobacco: Never  Vaping Use   Vaping Use: Never used  Substance and Sexual Activity   Alcohol use: No   Drug use: No   Sexual activity: Not on file  Other Topics Concern   Not on file  Social History Narrative   Lives alone    Right handed   Caffeine: none    Social Determinants of Health   Financial Resource Strain: Not on file  Food Insecurity: Not on file  Transportation Needs: Not on file  Physical Activity: Not on file  Stress: Not on file  Social Connections: Not on file     Family History: The patient's family history includes Bleeding Disorder in his sister; Diabetes in his sister; Stroke in his son.  ROS:   Please see the history of present illness.     All other systems reviewed and are negative.  EKGs/Labs/Other Studies Reviewed:    The following studies were reviewed today:  ABIs 2022 Summary:  Right: Resting right ankle-brachial index is within normal range. No  evidence of significant right lower extremity arterial disease. The right  toe-brachial index is normal.   Left: Resting left ankle-brachial index is within normal range. No  evidence of significant left lower extremity arterial disease. The left  toe-brachial index is normal.   Possible tibial vessel disease involving the  bilateral PTAs.  Myoview [place] 06/2021   The left ventricular ejection fraction is normal (55-65%). Nuclear stress EF: 63%. No T wave inversion was noted during stress. There was no ST segment deviation noted during stress. The study is normal. This is a low risk study.   No reversible ischemia. LVEF 63% with normal wall motion. This a low risk study. Compared to a prior study in 2018, this study demonstrates no perfusion defects.  EKG:  EKG is ordered today.  The ekg ordered today demonstrates NSR 68bpm, nonspecific T wave changes  Recent Labs: 06/06/2022: ALT 20; BUN 21; Creatinine, Ser 1.14; Hemoglobin 14.7; Magnesium 1.8; Platelets 149; Potassium 3.4;  Sodium 135  Recent Lipid Panel    Component Value Date/Time   CHOL 96 (L) 06/01/2018 1001   TRIG 101 06/01/2018 1001   HDL 38 (L) 06/01/2018 1001   CHOLHDL 2.5 06/01/2018 1001   LDLCALC 38 06/01/2018 1001    Physical Exam:    VS:  BP (!) 146/92   Ht 6' (1.829 m)   Wt 195 lb (88.5 kg)   BMI 26.45 kg/m     Wt Readings from Last 3 Encounters:  08/12/22 195 lb (88.5 kg)  07/03/22 202 lb 3.2 oz (91.7 kg)  06/27/22 200 lb (90.7 kg)     GEN:  Well nourished, well developed in no acute distress HEENT: Normal NECK: No JVD; No carotid bruits LYMPHATICS: No lymphadenopathy CARDIAC: RRR, no murmurs, rubs, gallops RESPIRATORY:  Clear to auscultation without rales, wheezing or rhonchi  ABDOMEN: Soft, non-tender, non-distended MUSCULOSKELETAL:  No edema; No deformity  SKIN: Warm and dry NEUROLOGIC:  Alert and oriented x 3 PSYCHIATRIC:  Normal affect   ASSESSMENT:    1. Atypical chest pain   2. Essential hypertension    PLAN:    In order of problems listed above:  Atypical chest pain He reports atypical chest pain, which seems to be chronic. Myoview in 2022 was normal with no ischemia. I will start Imdur 15mg  daily.   HTN BP mildly elevated today. He is only taking lopressor once a  day, we can make this 50mg  BID. I will start Imdur 15mg  daily for chest pain and elevated BP. Continue Losartan 100mg  daily and HCTZ 25mg  daily.    Disposition: Follow up in 3 month(s) with MD/APP    Signed, Lurleen Soltero , PA-C  08/12/2022 9:17 PM    [facility]

## 2022-10-02 IMAGING — CT CT HEAD W/O CM
3 series · 14 of 47 positions shown, 16 images · non-contrast
Comparison: None Available.

CLINICAL DATA: Neuro deficit, acute, stroke suspected; Cervical
radiculopathy, no red flags



[Series 3: head wo · axial · 0.45mm/px · z∈[-66,+74]mm · 8 of 34 slices shown, 10 images]
[im 3/34  brain]
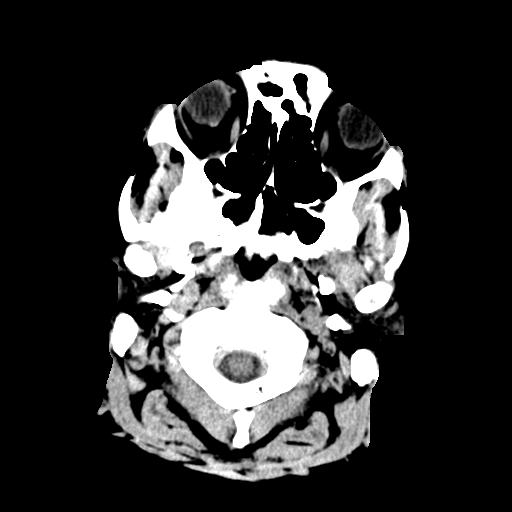
[im 3/34  bone]
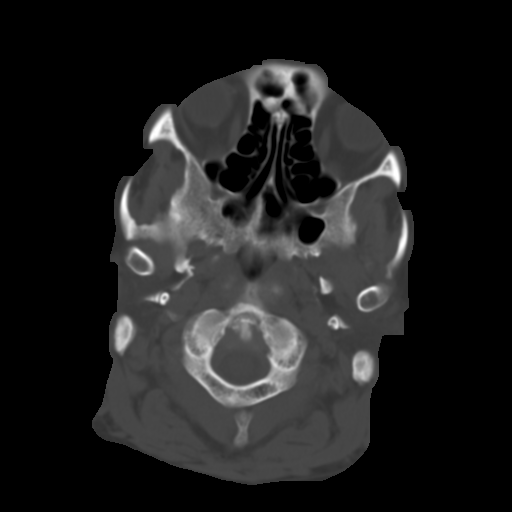
[im 7/34  brain]
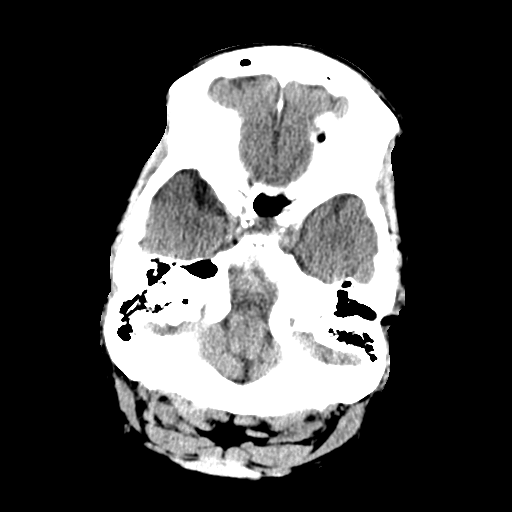
[im 11/34  brain]
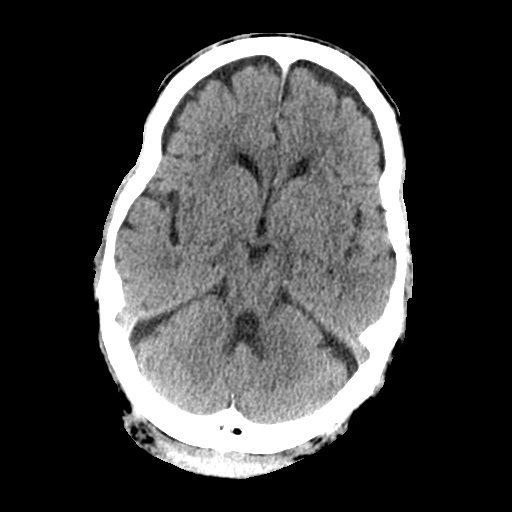
[im 15/34  brain]
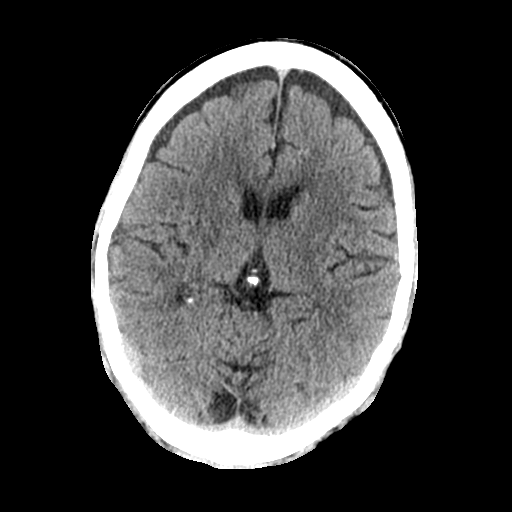
[im 19/34  brain]
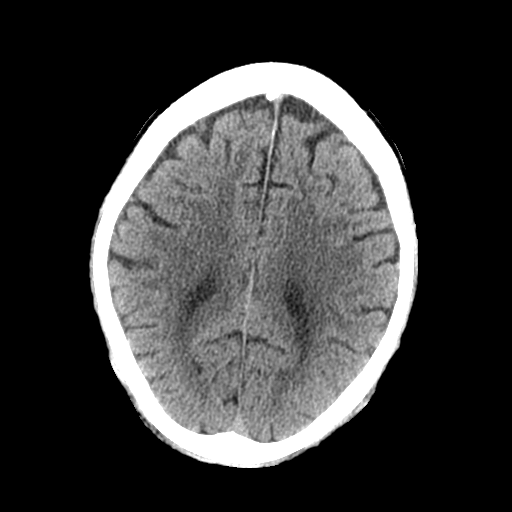
[im 19/34  bone]
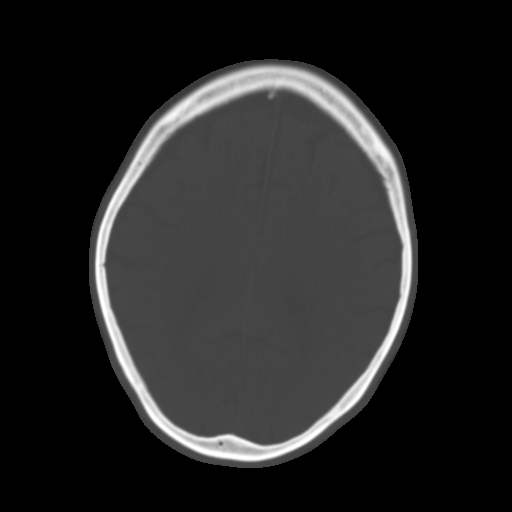
[im 23/34  brain]
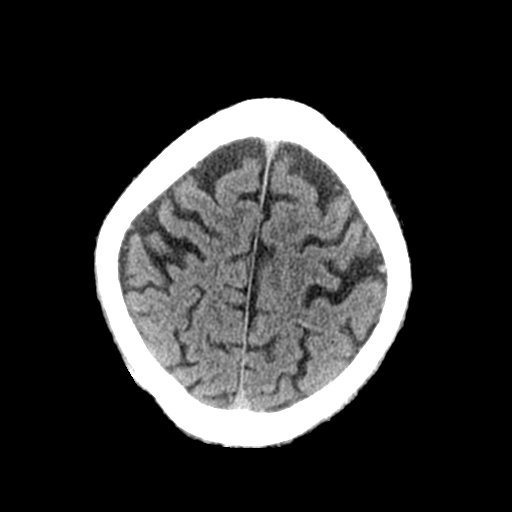
[im 27/34  brain]
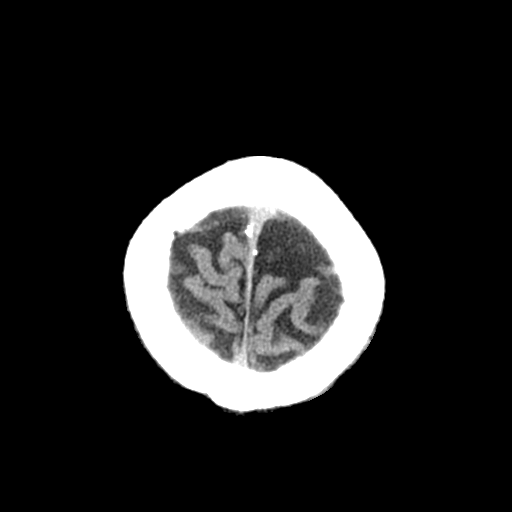
[im 31/34  brain]
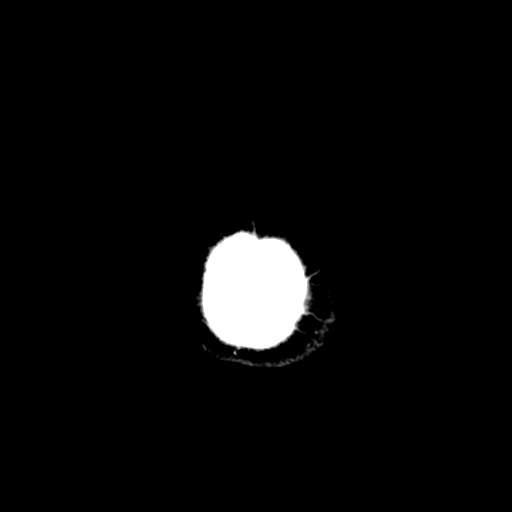

[Series 6: coronal soft tissue · coronal · 0.36mm/px · 3 of 72 slices shown]
[im 25/72  brain]
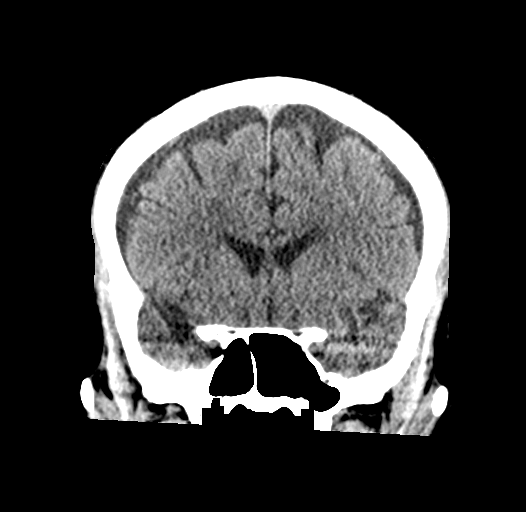
[im 32/72  brain]
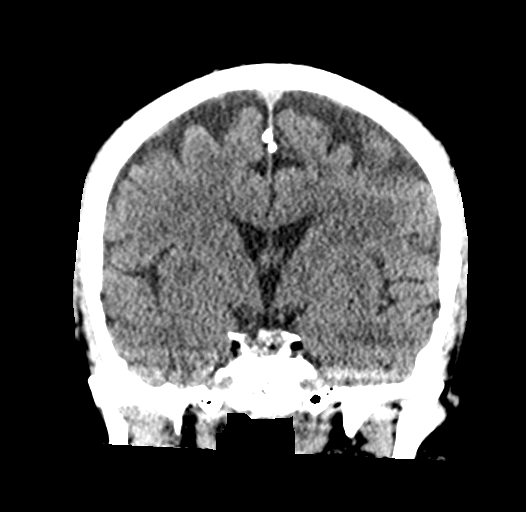
[im 40/72  brain]
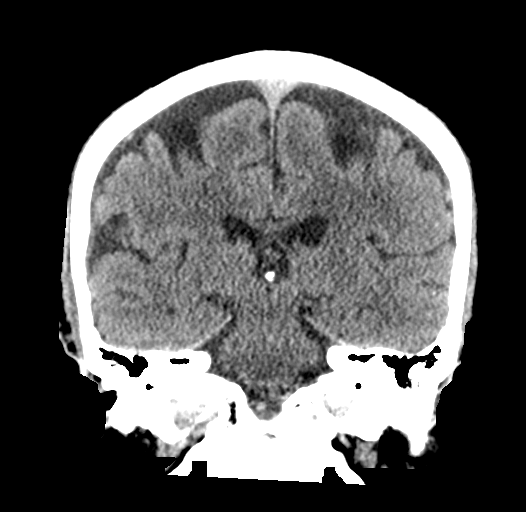

[Series 7: sagittal soft tissue · sagittal · 0.38mm/px · 3 of 60 slices shown]
[im 20/60  brain]
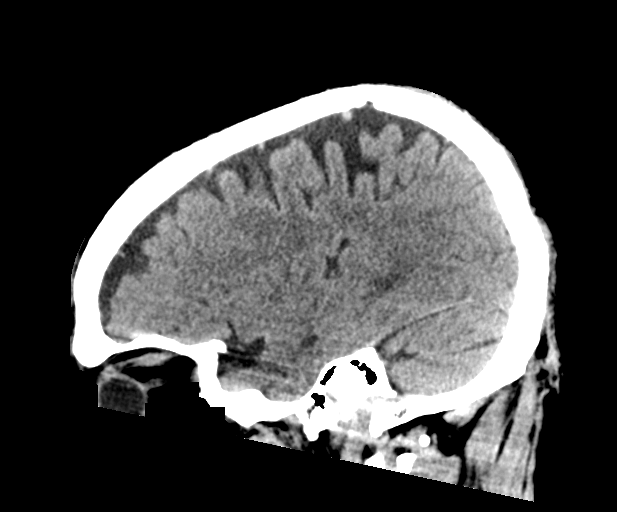
[im 30/60  brain]
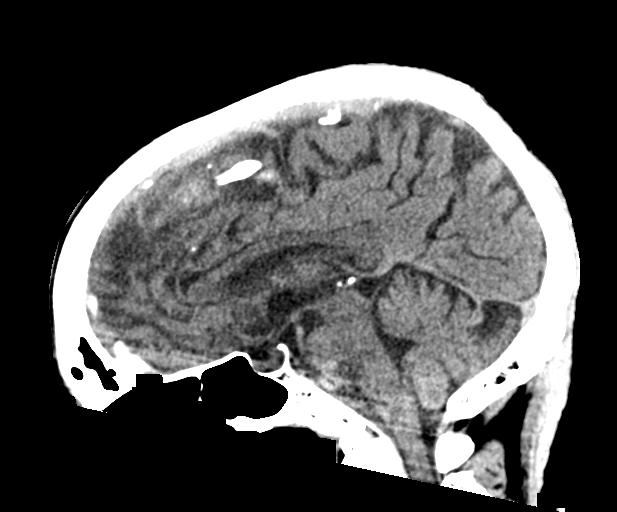
[im 40/60  brain]
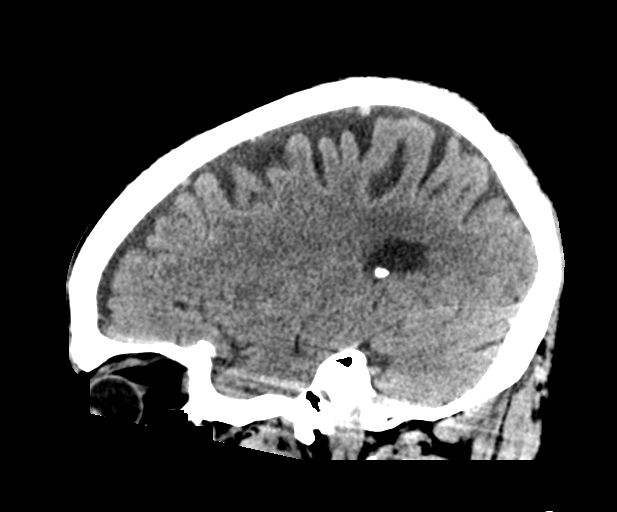

[14 of 47 positions shown; findings below may reference images not displayed]

FINDINGS: CT HEAD FINDINGS

Brain: Normal anatomic configuration. No abnormal intra or
extra-axial mass lesion or fluid collection. No abnormal mass effect
or midline shift. No evidence of acute intracranial hemorrhage or
infarct. Ventricular size is normal. Cerebellum unremarkable.

Vascular: Unremarkable

Skull: Intact

Sinuses/Orbits: Paranasal sinuses are clear. Orbits are
unremarkable.

Other: Mastoid air cells and middle ear cavities are clear.

CT CERVICAL SPINE FINDINGS

Alignment: Normal.

Skull base and vertebrae: Craniocervical alignment is normal. The
atlantodental interval is not widened. No acute fracture of the
cervical spine. Vertebral body height is preserved.

Soft tissues and spinal canal: No prevertebral fluid or swelling. No
visible canal hematoma.

Disc levels: There is intervertebral disc space narrowing and
endplate remodeling at C5-6 and to a lesser extent C6-7 and C7-T1 in
keeping with changes of degenerative disc disease, severe at C5-6.
Prevertebral soft tissues are not thickened. The spinal canal is
mildly, diffusely congenitally narrowed secondary to congenital
shortening of the pedicles. Superimposed multilevel uncovertebral
and facet arthrosis results in multilevel moderate to severe
neuroforaminal narrowing, most severe on the left at C4-5 and C5-6

Upper chest: 8 mm spiculated pulmonary nodule is seen within the
right apex at axial image # 86/3, indeterminate.

Other: None
IMPRESSION: 1. No acute intracranial abnormality. No calvarial fracture.
2. No acute fracture or listhesis of the cervical spine.
3. Multilevel degenerative disc and degenerative joint disease
resulting in multilevel neuroforaminal narrowing, most severe on the
left at C4-5 and C5-6.
4. 8 mm spiculated pulmonary nodule within the right apex,
indeterminate. If indicated, this would be better assessed with
dedicated nonemergent CT examination of the chest.

## 2022-10-02 IMAGING — CT CT CERVICAL SPINE W/O CM
2 series · 13 of 27 positions shown, 16 images · non-contrast
Comparison: None Available.

CLINICAL DATA: Neuro deficit, acute, stroke suspected; Cervical
radiculopathy, no red flags



[Series 4: c spine soft · axial · 0.43mm/px · z∈[-195,-41]mm · 8 of 91 slices shown, 10 images]
[im 7/91  soft-tissue]
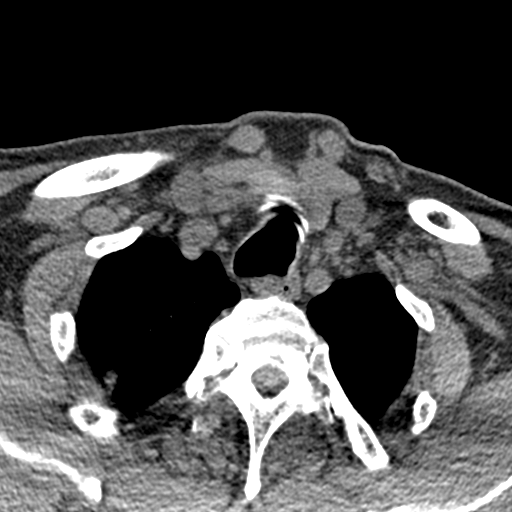
[im 7/91  bone]
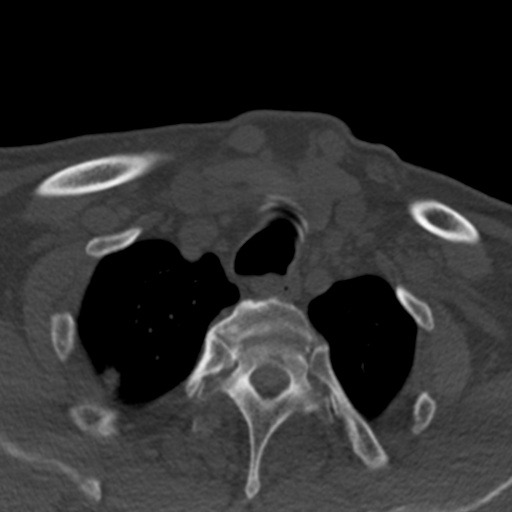
[im 21/91  bone]
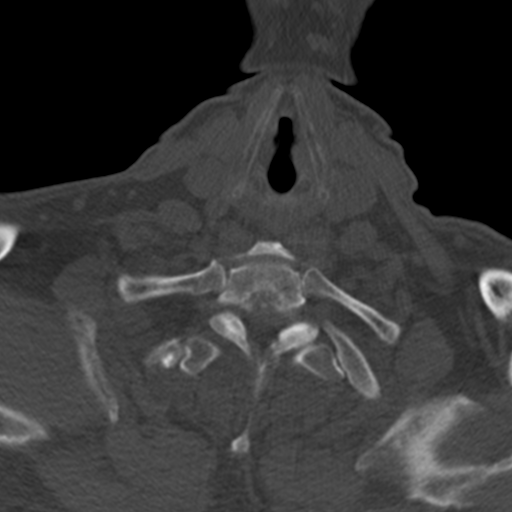
[im 28/91  bone]
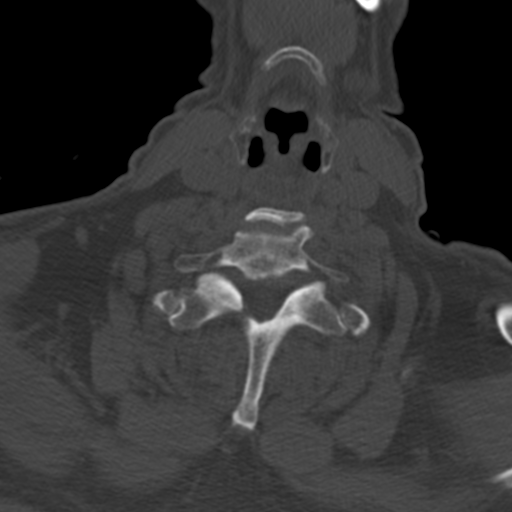
[im 42/91  bone]
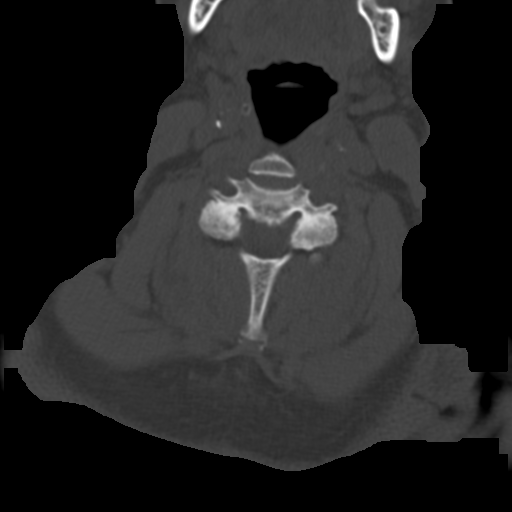
[im 49/91  soft-tissue]
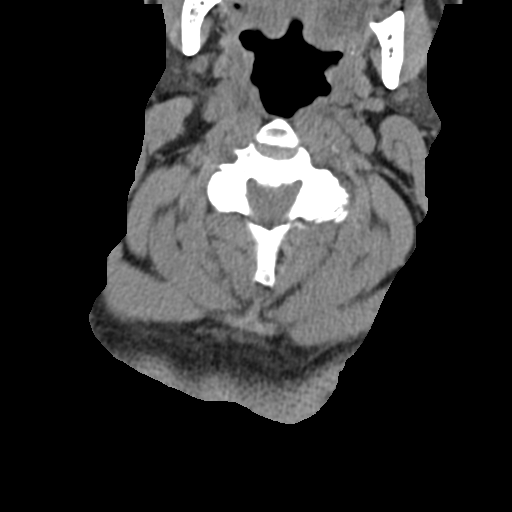
[im 49/91  bone]
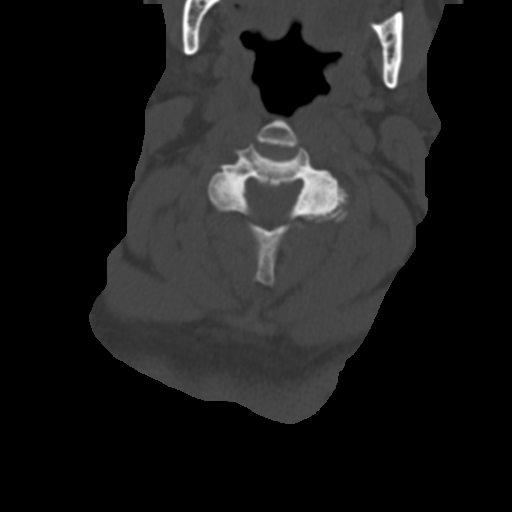
[im 63/91  bone]
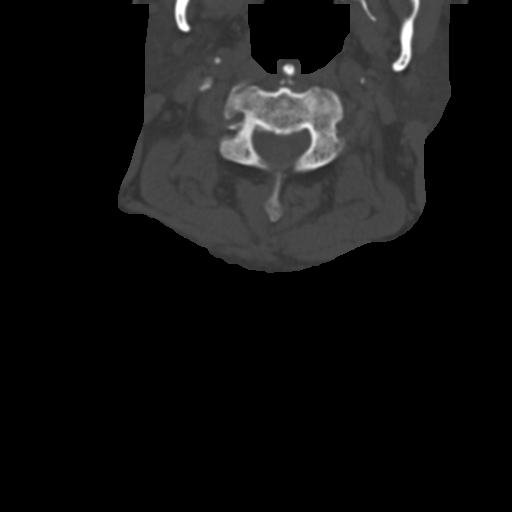
[im 70/91  bone]
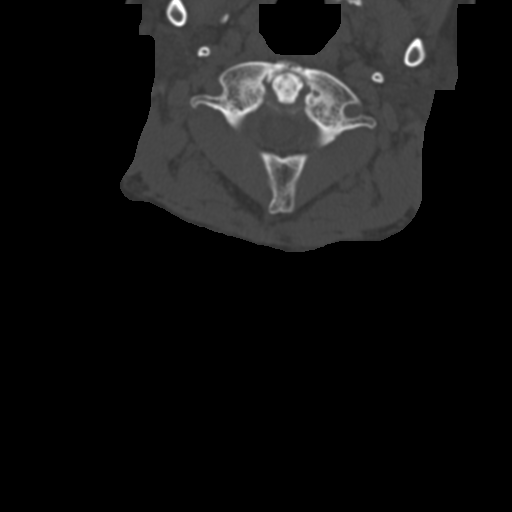
[im 84/91  bone]
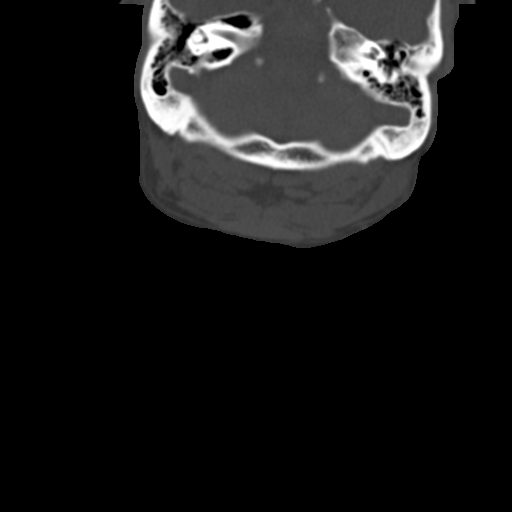

[Series 8: sagittal bone · sagittal · 0.38mm/px · 5 of 61 slices shown, 6 images]
[im 21/61  bone]
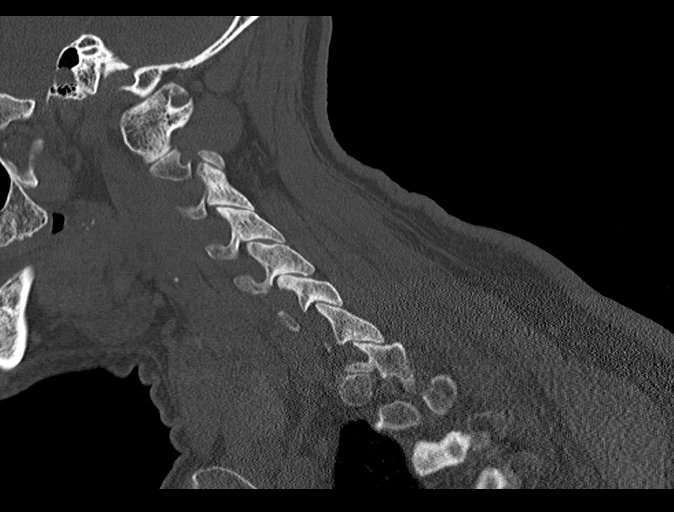
[im 26/61  bone]
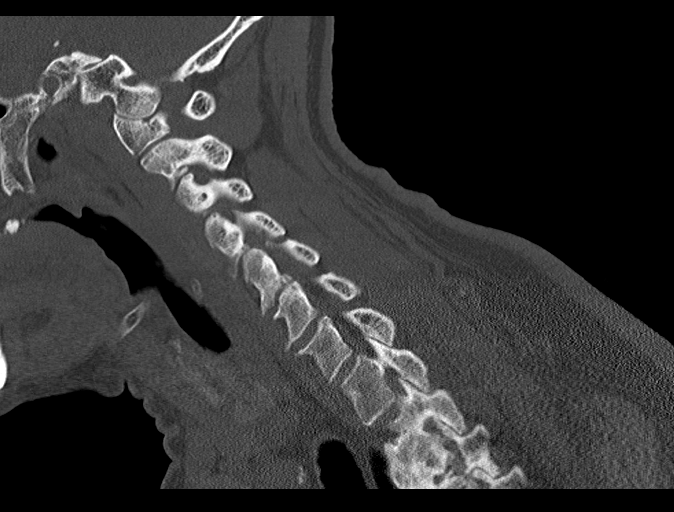
[im 31/61  soft-tissue]
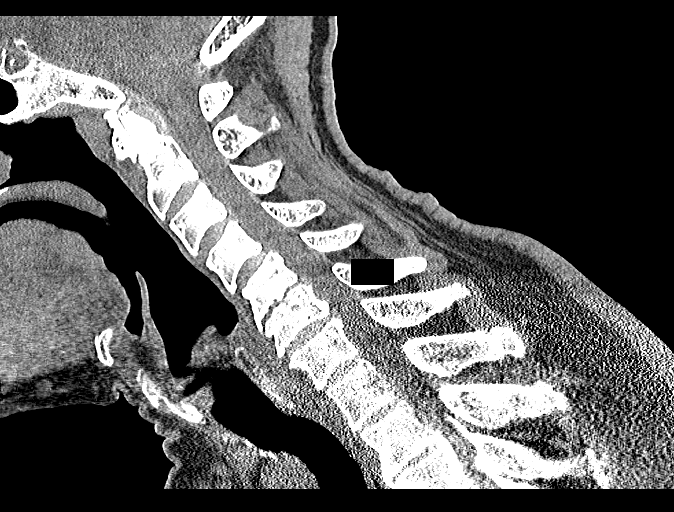
[im 31/61  bone]
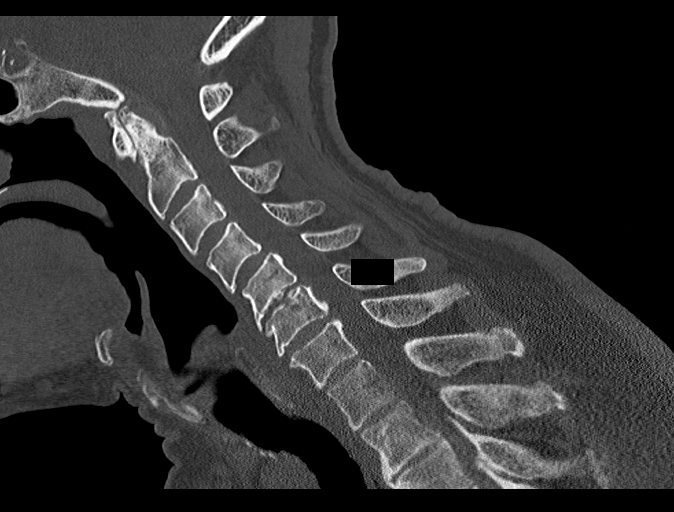
[im 36/61  bone]
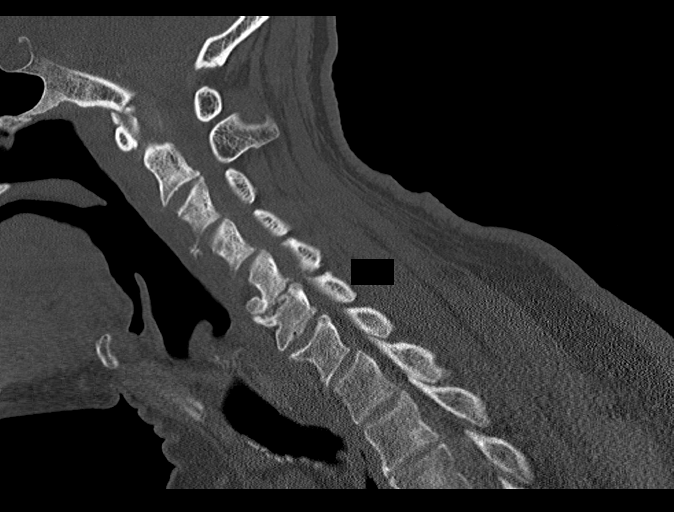
[im 41/61  bone]
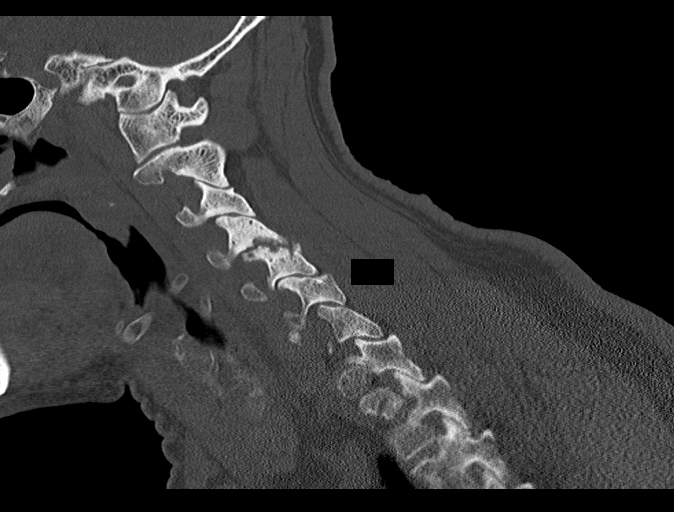

[13 of 27 positions shown; findings below may reference images not displayed]

FINDINGS: CT HEAD FINDINGS

Brain: Normal anatomic configuration. No abnormal intra or
extra-axial mass lesion or fluid collection. No abnormal mass effect
or midline shift. No evidence of acute intracranial hemorrhage or
infarct. Ventricular size is normal. Cerebellum unremarkable.

Vascular: Unremarkable

Skull: Intact

Sinuses/Orbits: Paranasal sinuses are clear. Orbits are
unremarkable.

Other: Mastoid air cells and middle ear cavities are clear.

CT CERVICAL SPINE FINDINGS

Alignment: Normal.

Skull base and vertebrae: Craniocervical alignment is normal. The
atlantodental interval is not widened. No acute fracture of the
cervical spine. Vertebral body height is preserved.

Soft tissues and spinal canal: No prevertebral fluid or swelling. No
visible canal hematoma.

Disc levels: There is intervertebral disc space narrowing and
endplate remodeling at C5-6 and to a lesser extent C6-7 and C7-T1 in
keeping with changes of degenerative disc disease, severe at C5-6.
Prevertebral soft tissues are not thickened. The spinal canal is
mildly, diffusely congenitally narrowed secondary to congenital
shortening of the pedicles. Superimposed multilevel uncovertebral
and facet arthrosis results in multilevel moderate to severe
neuroforaminal narrowing, most severe on the left at C4-5 and C5-6

Upper chest: 8 mm spiculated pulmonary nodule is seen within the
right apex at axial image # 86/3, indeterminate.

Other: None
IMPRESSION: 1. No acute intracranial abnormality. No calvarial fracture.
2. No acute fracture or listhesis of the cervical spine.
3. Multilevel degenerative disc and degenerative joint disease
resulting in multilevel neuroforaminal narrowing, most severe on the
left at C4-5 and C5-6.
4. 8 mm spiculated pulmonary nodule within the right apex,
indeterminate. If indicated, this would be better assessed with
dedicated nonemergent CT examination of the chest.

## 2022-11-19 ENCOUNTER — Ambulatory Visit: Payer: Medicare Other | Admitting: Medical

## 2022-12-30 ENCOUNTER — Ambulatory Visit: Payer: Medicare Other | Attending: Medical | Admitting: Medical

## 2022-12-30 NOTE — Progress Notes (Deleted)
Cardiology Office Note:    Date:  12/30/2022   ID:  [place], DOB 1941/04/21, MRN [id number]  PCP:  [name], MD  [facility] Cardiologist:  Yvonne Kendall, MD  [facility] Electrophysiologist:  None   Referring MD: [name], MD   Chief Complaint: ***  History of Present Illness:    Ronald Ramirez is a 82 y.o. male with a hx of hypertension, hyperlipidemia, type 2 diabetes mellitus, and anxiety who presents for 3 month follow-up.   Stress test July 2022 that was low risk and without ischemia or scar.    The patient was seen 09/2021 and reported atypical chest pain and cold feet. ABIs were ordered. This showed normal right ABI and normal left ABI with possible tibial vessel disease.   Last seen 07/2022 and reported occasional atypical chest pain that seem to be chronic.  Patient was started on Imdur 50 mg daily.  Today,   Past Medical History:  Diagnosis Date   Anxiety    Chest pain    NUC stress 11/09- overall low risk, no ishemia, normal EF, exercise time 5:01, NSSSTW changes   Diabetes mellitus without complication (HCC)    Hypertension    Occasional tremors    PVC's (premature ventricular contractions)     Past Surgical History:  Procedure Laterality Date   HERNIA REPAIR      Current Medications: No outpatient medications have been marked as taking for the 12/30/22 encounter (Appointment) with Fransico Michael, Ricard Faulkner H, PA-C.     Allergies:   Lantus [insulin glargine] and Metformin and related   Social History   Socioeconomic History   Marital status: Widowed    Spouse name: Not on file   Number of children: Not on file   Years of education: Not on file   Highest education level: Not on file  Occupational History   Not on file  Tobacco Use   Smoking status: Never   Smokeless tobacco: Never  Vaping Use   Vaping Use: Never used  Substance and Sexual Activity   Alcohol use: No   Drug use: No   Sexual activity: Not on file  Other  Topics Concern   Not on file  Social History Narrative   Lives alone    Right handed   Caffeine: none    Social Determinants of Health   Financial Resource Strain: Not on file  Food Insecurity: Not on file  Transportation Needs: Not on file  Physical Activity: Not on file  Stress: Not on file  Social Connections: Not on file     Family History: The patient's ***family history includes Bleeding Disorder in his sister; Diabetes in his sister; Stroke in his son.  ROS:   Please see the history of present illness.    *** All other systems reviewed and are negative.  EKGs/Labs/Other Studies Reviewed:    The following studies were reviewed today: ***  EKG:  EKG is *** ordered today.  The ekg ordered today demonstrates ***  Recent Labs: 06/06/2022: ALT 20; BUN 21; Creatinine, Ser 1.14; Hemoglobin 14.7; Magnesium 1.8; Platelets 149; Potassium 3.4; Sodium 135  Recent Lipid Panel    Component Value Date/Time   CHOL 96 (L) 06/01/2018 1001   TRIG 101 06/01/2018 1001   HDL 38 (L) 06/01/2018 1001   CHOLHDL 2.5 06/01/2018 1001   LDLCALC 38 06/01/2018 1001     Risk Assessment/Calculations:   {Does this patient have ATRIAL FIBRILLATION?[phone]}   Physical Exam:  VS:  There were no vitals taken for this visit.    Wt Readings from Last 3 Encounters:  08/12/22 195 lb (88.5 kg)  07/03/22 202 lb 3.2 oz (91.7 kg)  06/27/22 200 lb (90.7 kg)     GEN: *** Well nourished, well developed in no acute distress HEENT: Normal NECK: No JVD; No carotid bruits LYMPHATICS: No lymphadenopathy CARDIAC: ***RRR, no murmurs, rubs, gallops RESPIRATORY:  Clear to auscultation without rales, wheezing or rhonchi  ABDOMEN: Soft, non-tender, non-distended MUSCULOSKELETAL:  No edema; No deformity  SKIN: Warm and dry NEUROLOGIC:  Alert and oriented x 3 PSYCHIATRIC:  Normal affect   ASSESSMENT:    No diagnosis found. PLAN:    In order of problems listed above:  ***  Disposition:  Follow up {follow up:15908} with ***   Shared Decision Making/Informed Consent   {Are you ordering a CV Procedure (e.g. stress test, cath, DCCV, TEE, etc)?   Press F2        :[id number]}    Signed, Rodman Recupero Arlyss Repress  12/30/2022 7:57 AM    [facility]

## 2022-12-31 ENCOUNTER — Encounter: Payer: Self-pay | Admitting: Medical

## 2023-02-04 ENCOUNTER — Ambulatory Visit (HOSPITAL_BASED_OUTPATIENT_CLINIC_OR_DEPARTMENT_OTHER)
Admission: RE | Admit: 2023-02-04 | Discharge: 2023-02-04 | Disposition: A | Discharge status: Home / Self Care | Payer: Medicare Other | Source: Ambulatory Visit | Attending: Emergency Medicine | Admitting: Emergency Medicine

## 2023-02-04 DIAGNOSIS — R911 Solitary pulmonary nodule: Secondary | ICD-10-CM | POA: Insufficient documentation

## 2023-02-12 ENCOUNTER — Telehealth: Payer: Self-pay | Admitting: Emergency Medicine

## 2023-02-12 NOTE — Telephone Encounter (Signed)
PT calling. Just had rads done. States he is not feeling well. Seeks info on those [employer]. Pls call @ [phone]

## 2023-02-13 NOTE — Telephone Encounter (Signed)
Called patient but he did not answer. His VM is not setup. Will attempt to call back.

## 2023-02-13 NOTE — Telephone Encounter (Signed)
Called and spoke with the pt  He states calling to go over CT Chest results  He had CT 02/04/23  Looking back at last visit, he wanted to have pt come in for ov after CT  I have him scheduled for this reason on 02/17/23  Nothing further needed

## 2023-02-13 NOTE — Telephone Encounter (Signed)
Patient is returning a call to the office.  Sorry he missed the call.  Will attempt to stay by the phone.  [phone]

## 2023-02-17 ENCOUNTER — Encounter: Payer: Self-pay | Admitting: Emergency Medicine

## 2023-02-17 ENCOUNTER — Ambulatory Visit: Payer: Medicare Other | Admitting: Emergency Medicine

## 2023-02-17 VITALS — BP 122/72 | HR 88 | Temp 97.6°F | Ht 72.0 in | Wt 201.6 lb

## 2023-02-17 DIAGNOSIS — R911 Solitary pulmonary nodule: Secondary | ICD-10-CM

## 2023-02-17 NOTE — Progress Notes (Signed)
Subjective:    Patient ID: Ronald Ramirez, male    DOB: [birth year], 82 y.o.   MRN: [id number]  HPI Ronald Ramirez is 82 years old, never smoker with a history of diabetes, hyperlipidemia, hypertension, PVC's.  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  Imaging has revealed multilevel DDD most severe at the left C4-5, C5-6.  Also showed a spuriously identified right upper lobe nodule as below.  He is referred today for evaluation of that nodule. No hx CA. He does cough daily, never hemoptysis.   Head CT 06/07/2022 reviewed by me showed no intracranial abnormality CT scan cervical spine 06/07/2022 showed degenerative disc disease as well as a newly identified right upper lobe 8 mm spiculated pulmonary nodule.  ROV 02/17/2023 --follow-up visit for 82 year old gentleman, never smoker with a history of hypertension, hyperlipidemia, diabetes, PVCs, cervical neuropathy.  He had a 8 mm right upper lobe pulmonary nodule identified on CT scan of the cervical spine in June 2023.  Dedicated CT chest performed on 07/15/2022 measured the nodule in a maximum of 1.5 cm in the coronal view. He is having some L mid-chest soreness, comes and goes. Occasional cough, non-productive. He had a URI 2 months ago - improved now.   CT scan of chest 02/04/2023 reviewed by me, shows no significant change in a 1.2 x 0.8 cm subsolid posterior right upper lobe pulmonary nodule.   Review of Systems As per HPI  Past Medical History:  Diagnosis Date   Anxiety    Chest pain    NUC stress 11/09- overall low risk, no ishemia, normal EF, exercise time 5:01, NSSSTW changes   Diabetes mellitus without complication (HCC)    Hypertension    Occasional tremors    PVC's (premature ventricular contractions)   Hyperlipidemia   Family History  Problem Relation Age of Onset   Diabetes Sister    Bleeding Disorder Sister    Stroke Son     No  family hx lung CA.   Social History   Socioeconomic History   Marital status: Widowed    Spouse name: Not on file   Number of children: Not on file   Years of education: Not on file   Highest education level: Not on file  Occupational History   Not on file  Tobacco Use   Smoking status: Never   Smokeless tobacco: Never  Vaping Use   Vaping Use: Never used  Substance and Sexual Activity   Alcohol use: No   Drug use: No   Sexual activity: Not on file  Other Topics Concern   Not on file  Social History Narrative   Lives alone    Right handed   Caffeine: none    Social Determinants of Health   Financial Resource Strain: Not on file  Food Insecurity: Not on file  Transportation Needs: Not on file  Physical Activity: Not on file  Stress: Not on file  Social Connections: Not on file  Intimate Partner Violence: Not on file    Has worked [employer], Teacher, music, 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     DM-APAP-CPM (CORICIDIN HBP PO) Take by mouth as needed.  hydrochlorothiazide (HYDRODIURIL) 25 MG tablet Take 1 tablet (25 mg total) by mouth daily. PLEASE SCHEDULE OFFICE VISIT FOR FURTHER REFILLS. THANK YOU! 30 tablet 0   insulin NPH-regular Human (70-30) 100 UNIT/ML injection Inject 25 Units into the skin 2 (two) times daily with a meal.     isosorbide mononitrate (IMDUR) 30 MG 24 hr tablet Take 0.5 tablets (15 mg total) by mouth daily. 45 tablet 3   losartan (COZAAR) 100 MG tablet TAKE 1 TABLET BY MOUTH EVERY DAY (Patient taking differently: Take 100 mg by mouth daily.) 90 tablet 3   metoprolol tartrate (LOPRESSOR) 100 MG tablet Take 50 mg by mouth in the morning and at bedtime.     gabapentin (NEURONTIN) 100 MG capsule Take 1  capsule (100 mg total) by mouth 2 (two) times daily. 60 capsule 3   No facility-administered medications prior to visit.        Objective:   Physical Exam  Vitals:   02/17/23 1527  BP: 122/72  Pulse: 88  Temp: 97.6 F (36.4 C)  TempSrc: Oral  SpO2: 99%  Weight: 201 lb 9.6 oz (91.4 kg)  Height: 6' (1.829 m)     Gen: Pleasant, well-nourished, in no distress,  normal affect  ENT: No lesions,  mouth clear,  oropharynx clear, no postnasal drip  Neck: No JVD, no stridor  Lungs: No use of accessory muscles, no crackles or wheezing on normal respiration, no wheeze on forced expiration  Cardiovascular: RRR, heart sounds normal, no murmur or gallops, no peripheral edema  Musculoskeletal: No deformities, no cyanosis or clubbing  Neuro: alert, awake, non focal  Skin: Warm, no lesions or rash     Assessment & Plan:   Pulmonary nodule 1.2 cm right upper lobe mixed density pulmonary nodule, unchanged compared with 07/15/2022, and a low risk patient.  Needs to be followed for interval change.  If suspicion for malignancy increases then would recommend navigational bronchoscopy.  We reviewed your CT scan of the chest today.  There is an unchanged small right upper lobe pulmonary nodule present. We will plan to repeat your CT scan of the chest without contrast in August 2024 to compare with your priors. Follow Dr. Lamonte Sakai in August after your CT so we can review the results together.   Ronald Apo, MD, PhD 02/17/2023, 3:40 PM [facility] [phone] or if no answer before 7:00PM call [phone] For any issues after 7:00PM please call eLink [phone]

## 2023-02-17 NOTE — Patient Instructions (Addendum)
We reviewed your CT scan of the chest today.  There is an unchanged small right upper lobe pulmonary nodule present. We will plan to repeat your CT scan of the chest without contrast in August 2024 to compare with your priors. Follow Dr. Lamonte Sakai in August after your CT so we can review the results together.

## 2023-02-17 NOTE — Assessment & Plan Note (Signed)
1.2 cm right upper lobe mixed density pulmonary nodule, unchanged compared with 07/15/2022, and a low risk patient.  Needs to be followed for interval change.  If suspicion for malignancy increases then would recommend navigational bronchoscopy.  We reviewed your CT scan of the chest today.  There is an unchanged small right upper lobe pulmonary nodule present. We will plan to repeat your CT scan of the chest without contrast in August 2024 to compare with your priors. Follow Dr. Lamonte Sakai in August after your CT so we can review the results together.

## 2023-02-17 NOTE — Addendum Note (Signed)
Addended by: Loma Sousa on: 02/17/2023 03:56 PM   Modules accepted: Orders

## 2023-03-09 ENCOUNTER — Telehealth: Payer: Self-pay | Admitting: Internal Medicine

## 2023-03-09 MED ORDER — METOPROLOL TARTRATE 100 MG PO TABS: 50.0000 mg | ORAL_TABLET | Freq: Two times a day (BID) | ORAL | 0 refills | Status: DC

## 2023-03-09 MED ORDER — LOSARTAN POTASSIUM 100 MG PO TABS: 100.0000 mg | ORAL_TABLET | Freq: Every day | ORAL | 0 refills | Status: DC

## 2023-03-09 NOTE — Telephone Encounter (Signed)
*  STAT* If patient is at the pharmacy, call can be transferred to refill team.   1. Which medications need to be refilled? (please list name of each medication and dose if known) losartan (COZAAR) 100 MG tablet    metoprolol tartrate (LOPRESSOR) 100 MG tablet    2. Which pharmacy/location (including street and city if local pharmacy) is medication to be sent to?  [id number] - [place], [place] - [address] AT [place]    3. Do they need a 30 day or 90 day supply? 90 day

## 2023-03-27 ENCOUNTER — Telehealth: Payer: Self-pay | Admitting: Internal Medicine

## 2023-03-27 MED ORDER — METOPROLOL TARTRATE 100 MG PO TABS: 50.0000 mg | ORAL_TABLET | Freq: Two times a day (BID) | ORAL | 0 refills | Status: DC

## 2023-03-27 MED ORDER — HYDROCHLOROTHIAZIDE 25 MG PO TABS
25.0000 mg | ORAL_TABLET | Freq: Every day | ORAL | 0 refills | Status: DC
Start: 2023-03-27 — End: 2024-01-06

## 2023-03-27 MED ORDER — LOSARTAN POTASSIUM 100 MG PO TABS: 100.0000 mg | ORAL_TABLET | Freq: Every day | ORAL | 0 refills | Status: DC

## 2023-03-27 NOTE — Telephone Encounter (Signed)
Requested Prescriptions   Signed Prescriptions Disp Refills   losartan (COZAAR) 100 MG tablet 30 tablet 0    Sig: Take 1 tablet (100 mg total) by mouth daily. Please keep appointment for further refills. Thank you!    Authorizing Provider: Marianne Sofia    Ordering User: Thayer Headings, Cerys Winget L   hydrochlorothiazide (HYDRODIURIL) 25 MG tablet 30 tablet 0    Sig: Take 1 tablet (25 mg total) by mouth daily. PLEASE KEEP SCHEDULED OFFICE VISIT FOR FURTHER REFILLS. THANK YOU!    Authorizing Provider: Marianne Sofia    Ordering User: Thayer Headings, Arayla Kruschke L   metoprolol tartrate (LOPRESSOR) 100 MG tablet 30 tablet 0    Sig: Take 0.5 tablets (50 mg total) by mouth in the morning and at bedtime. Take 50 mg by mouth in the morning and at bedtime. Appointment needed for addiational refill with completed office visit.Thank you    Authorizing Provider: Marianne Sofia    Ordering User: Thayer Headings, Zenaida Tesar L

## 2023-03-27 NOTE — Telephone Encounter (Signed)
*  STAT* If patient is at the pharmacy, call can be transferred to refill team.   1. Which medications need to be refilled? (please list name of each medication and dose if known)? hydrochlorothiazide (HYDRODIURIL) 25 MG tablet  losartan (COZAAR) 100 MG tablet  metoprolol tartrate (LOPRESSOR) 100 MG tablet   2. Which pharmacy/location (including street and city if local pharmacy) is medication to be sent to?  [id number] - [place], [place] - [address]    3. Do they need a 30 day or 90 day supply? 90 day    Pt has appt scheduled for 04/21/2023

## 2023-04-19 ENCOUNTER — Other Ambulatory Visit: Payer: Self-pay | Admitting: Medical

## 2023-04-21 ENCOUNTER — Telehealth: Payer: Self-pay | Admitting: Internal Medicine

## 2023-04-21 ENCOUNTER — Ambulatory Visit: Payer: Medicare Other | Attending: Medical | Admitting: Medical

## 2023-04-21 NOTE — Telephone Encounter (Signed)
Patient would like to switch to the Dr in [place] because its closer to where he lives. Please advise

## 2023-04-21 NOTE — Telephone Encounter (Signed)
That is fine with me.  Ronald Pointer, MD [facility]  

## 2023-04-21 NOTE — Progress Notes (Deleted)
Cardiology Office Note:    Date:  04/21/2023   ID:  [place], DOB Jun 25, 1941, MRN [id number]  PCP:  [name], MD  [facility] Cardiologist:  Yvonne Kendall, MD  [facility] Electrophysiologist:  None   Referring MD: [name], MD   Chief Complaint: 6 month follow-up  History of Present Illness:    Coner Maue is a 82 y.o. male with a hx of ypertension, hyperlipidemia, type 2 diabetes mellitus, and anxiety who presents for 3 month follow-up.   Stress test July 2022 that was low risk and without ischemia or scar.   The patient was seen 09/2021 and reported atypical chest pain and cold feet. ABIs were ordered. This showed normal right ABI and normal left ABI with possible tibial vessel disease.   Last seen 08/12/22 reporting occasional atypical chest pain.  Patient was started on Imdur.  Today,  Past Medical History:  Diagnosis Date   Anxiety    Chest pain    NUC stress 11/09- overall low risk, no ishemia, normal EF, exercise time 5:01, NSSSTW changes   Diabetes mellitus without complication (HCC)    Hypertension    Occasional tremors    PVC's (premature ventricular contractions)     Past Surgical History:  Procedure Laterality Date   HERNIA REPAIR      Current Medications: No outpatient medications have been marked as taking for the 04/21/23 encounter (Appointment) with Fransico Michael, Ingvald Theisen H, PA-C.     Allergies:   Lantus [insulin glargine] and Metformin and related   Social History   Socioeconomic History   Marital status: Widowed    Spouse name: Not on file   Number of children: Not on file   Years of education: Not on file   Highest education level: Not on file  Occupational History   Not on file  Tobacco Use   Smoking status: Never   Smokeless tobacco: Never  Vaping Use   Vaping Use: Never used  Substance and Sexual Activity   Alcohol use: No   Drug use: No   Sexual activity: Not on file  Other Topics Concern   Not on  file  Social History Narrative   Lives alone    Right handed   Caffeine: none    Social Determinants of Health   Financial Resource Strain: Not on file  Food Insecurity: Not on file  Transportation Needs: Not on file  Physical Activity: Not on file  Stress: Not on file  Social Connections: Not on file     Family History: The patient's ***family history includes Bleeding Disorder in his sister; Diabetes in his sister; Stroke in his son.  ROS:   Please see the history of present illness.    *** All other systems reviewed and are negative.  EKGs/Labs/Other Studies Reviewed:    The following studies were reviewed today: ***  EKG:  EKG is *** ordered today.  The ekg ordered today demonstrates ***  Recent Labs: 06/06/2022: ALT 20; BUN 21; Creatinine, Ser 1.14; Hemoglobin 14.7; Magnesium 1.8; Platelets 149; Potassium 3.4; Sodium 135  Recent Lipid Panel    Component Value Date/Time   CHOL 96 (L) 06/01/2018 1001   TRIG 101 06/01/2018 1001   HDL 38 (L) 06/01/2018 1001   CHOLHDL 2.5 06/01/2018 1001   LDLCALC 38 06/01/2018 1001     Risk Assessment/Calculations:   {Does this patient have ATRIAL FIBRILLATION?:[phone]}   Physical Exam:    VS:  There were no vitals taken for  this visit.    Wt Readings from Last 3 Encounters:  02/17/23 201 lb 9.6 oz (91.4 kg)  08/12/22 195 lb (88.5 kg)  07/03/22 202 lb 3.2 oz (91.7 kg)     GEN: *** Well nourished, well developed in no acute distress HEENT: Normal NECK: No JVD; No carotid bruits LYMPHATICS: No lymphadenopathy CARDIAC: ***RRR, no murmurs, rubs, gallops RESPIRATORY:  Clear to auscultation without rales, wheezing or rhonchi  ABDOMEN: Soft, non-tender, non-distended MUSCULOSKELETAL:  No edema; No deformity  SKIN: Warm and dry NEUROLOGIC:  Alert and oriented x 3 PSYCHIATRIC:  Normal affect   ASSESSMENT:    No diagnosis found. PLAN:    In order of problems listed above:  ***  Disposition: Follow up {follow  up:15908} with ***   Shared Decision Making/Informed Consent   {Are you ordering a CV Procedure (e.g. stress test, cath, DCCV, TEE, etc)?   Press F2        :[id number]}    Signed, Ayaana Biondo David Stall, PA-C  04/21/2023 8:04 AM    [facility]

## 2023-04-22 ENCOUNTER — Encounter: Payer: Self-pay | Admitting: Medical

## 2023-06-22 ENCOUNTER — Other Ambulatory Visit: Payer: Self-pay | Admitting: Internal Medicine

## 2023-06-22 ENCOUNTER — Telehealth: Payer: Self-pay | Admitting: Cardiovascular Disease

## 2023-06-22 NOTE — Telephone Encounter (Signed)
Unable to leave voice mail to reschedule missed appt.

## 2023-06-22 NOTE — Telephone Encounter (Signed)
Please contact pt for future appointment. °Pt overdue for 3 month f/u. °Pt needing refills. °

## 2023-06-22 NOTE — Telephone Encounter (Signed)
Unable to leave voice mail to schedule appt

## 2023-06-23 NOTE — Telephone Encounter (Signed)
Unable to leave voice mail to schedule appt

## 2023-06-23 NOTE — Telephone Encounter (Signed)
Pt is scheduled on 9.19

## 2023-07-19 ENCOUNTER — Emergency Department (HOSPITAL_COMMUNITY): Payer: Medicare Other

## 2023-07-19 ENCOUNTER — Emergency Department (HOSPITAL_COMMUNITY)
Admission: EM | Admit: 2023-07-19 | Discharge: 2023-07-19 | Disposition: A | Discharge status: Home / Self Care | Payer: Medicare Other | Attending: Emergency Medicine | Admitting: Emergency Medicine

## 2023-07-19 ENCOUNTER — Other Ambulatory Visit: Payer: Self-pay

## 2023-07-19 DIAGNOSIS — R42 Dizziness and giddiness: Secondary | ICD-10-CM | POA: Diagnosis present

## 2023-07-19 DIAGNOSIS — H81393 Other peripheral vertigo, bilateral: Secondary | ICD-10-CM | POA: Diagnosis not present

## 2023-07-19 DIAGNOSIS — E119 Type 2 diabetes mellitus without complications: Secondary | ICD-10-CM | POA: Insufficient documentation

## 2023-07-19 DIAGNOSIS — Z79899 Other long term (current) drug therapy: Secondary | ICD-10-CM | POA: Insufficient documentation

## 2023-07-19 DIAGNOSIS — I10 Essential (primary) hypertension: Secondary | ICD-10-CM | POA: Insufficient documentation

## 2023-07-19 DIAGNOSIS — Z794 Long term (current) use of insulin: Secondary | ICD-10-CM | POA: Insufficient documentation

## 2023-07-19 LAB — COMPREHENSIVE METABOLIC PANEL
ALT: 28 U/L (ref 0–44)
AST: 26 U/L (ref 15–41)
Albumin: 3.9 g/dL (ref 3.5–5.0)
Alkaline Phosphatase: 64 U/L (ref 38–126)
Anion gap: 10 (ref 5–15)
BUN: 23 mg/dL (ref 8–23)
CO2: 26 mmol/L (ref 22–32)
Calcium: 9.3 mg/dL (ref 8.9–10.3)
Chloride: 96 mmol/L — ABNORMAL LOW (ref 98–111)
Creatinine, Ser: 1.38 mg/dL — ABNORMAL HIGH (ref 0.61–1.24)
GFR, Estimated: 51 mL/min — ABNORMAL LOW (ref >60)
Glucose, Bld: 249 mg/dL — ABNORMAL HIGH (ref 70–99)
Potassium: 3.7 mmol/L (ref 3.5–5.1)
Sodium: 132 mmol/L — ABNORMAL LOW (ref 135–145)
Total Bilirubin: 1.3 mg/dL — ABNORMAL HIGH (ref 0.3–1.2)
Total Protein: 8.2 g/dL — ABNORMAL HIGH (ref 6.5–8.1)

## 2023-07-19 LAB — CBC
HCT: 50.6 % (ref 39.0–52.0)
Hemoglobin: 15.7 g/dL (ref 13.0–17.0)
MCH: 22.3 pg — ABNORMAL LOW (ref 26.0–34.0)
MCHC: 31 g/dL (ref 30.0–36.0)
MCV: 71.8 fL — ABNORMAL LOW (ref 80.0–100.0)
Platelets: 164 10*3/uL (ref 150–400)
RBC: 7.05 MIL/uL — ABNORMAL HIGH (ref 4.22–5.81)
RDW: 17.6 % — ABNORMAL HIGH (ref 11.5–15.5)
WBC: 4.6 10*3/uL (ref 4.0–10.5)
nRBC: 0 % (ref 0.0–0.2)

## 2023-07-19 LAB — URINALYSIS, ROUTINE W REFLEX MICROSCOPIC
Bacteria, UA: NONE SEEN
Bilirubin Urine: NEGATIVE
Glucose, UA: >=500 mg/dL — AB
Hgb urine dipstick: NEGATIVE
Ketones, ur: NEGATIVE mg/dL
Leukocytes,Ua: NEGATIVE
Nitrite: NEGATIVE
Protein, ur: NEGATIVE mg/dL
Specific Gravity, Urine: 1.006 (ref 1.005–1.030)
pH: 6 (ref 5.0–8.0)

## 2023-07-19 NOTE — ED Triage Notes (Signed)
Pt arrived via POV. C/o dizziness for 4x days, and tinnitus for 1x week. Pt states they have been feeling congested. No major focal deficits noted in triage.  [id number]

## 2023-07-19 NOTE — ED Provider Notes (Signed)
[place] EMERGENCY DEPARTMENT AT [facility] Provider Note   CSN: [id number] Arrival date & time: 07/19/23  1122     History  Chief Complaint  Patient presents with   Dizziness   Tinnitus    Ronald Ramirez is a 82 y.o. male history of hypertension, diabetes presenting with dizziness for the past week.  Patient states he gets dizzy when he goes from a lying to a standing position and while he is dizzy he gets a headache around his head with ringing in both ears.  Patient denies LOC, new onset weakness, thunderclap headache, neck pain, vision changes, blood thinners.  Patient denies any recent illnesses or fevers.  Patient states that his dizziness comes and goes and last for few moments but that he feels unstable for this few moments.  Patient denied head trauma.  Patient states his dizziness is nonpositional.  Patient is here to get a head CT.   Home Medications Prior to Admission medications   Medication Sig Start Date End Date Taking? Authorizing Provider  clonazePAM (KLONOPIN) 1 MG tablet Take 1 mg by mouth daily. 08/14/21   [provider]  DM-APAP-CPM (CORICIDIN HBP PO) Take by mouth as needed.    [provider]  gabapentin (NEURONTIN) 100 MG capsule Take 1 capsule (100 mg total) by mouth 2 (two) times daily. 06/27/22 10/25/22  Windell Norfolk, MD  hydrochlorothiazide (HYDRODIURIL) 25 MG tablet Take 1 tablet (25 mg total) by mouth daily. PLEASE KEEP SCHEDULED OFFICE VISIT FOR FURTHER REFILLS. THANK YOU! 03/27/23   Furth, Cadence H, PA-C  insulin NPH-regular Human (70-30) 100 UNIT/ML injection Inject 25 Units into the skin 2 (two) times daily with a meal.    [provider]  isosorbide mononitrate (IMDUR) 30 MG 24 hr tablet Take 0.5 tablets (15 mg total) by mouth daily. 08/12/22   Furth, Cadence H, PA-C  losartan (COZAAR) 100 MG tablet Take 1 tablet (100 mg total) by mouth daily. PLEASE KEEP 08/2023 APPOINTMENT FOR FURTHER REFILLS 06/23/23   End,  Cristal Deer, MD  metoprolol tartrate (LOPRESSOR) 100 MG tablet TAKE 0.5 TABLETS (50 MG TOTAL) BY MOUTH IN THE MORNING AND AT BEDTIME. TAKE 50 MG BY MOUTH IN THE MORNING AND AT BEDTIME. APPOINTMENT NEEDED FOR ADDIATIONAL REFILL WITH COMPLETED OFFICE VISIT.THANK YOU 04/20/23   Furth, Cadence H, PA-C      Allergies    Lantus [insulin glargine] and Metformin and related    Review of Systems   Review of Systems  Neurological:  Positive for dizziness.    Physical Exam Updated Vital Signs BP (!) 171/91 (BP Location: Left Arm)   Pulse 64   Temp 97.7 F (36.5 C) (Oral)   Resp 17   Ht 6' (1.829 m)   Wt 88.9 kg   SpO2 95%   BMI 26.58 kg/m  Physical Exam Vitals reviewed.  Constitutional:      General: He is not in acute distress. HENT:     Head: Normocephalic and atraumatic.     Right Ear: Tympanic membrane, ear canal and external ear normal.     Left Ear: Tympanic membrane, ear canal and external ear normal.  Eyes:     Extraocular Movements: Extraocular movements intact.     Conjunctiva/sclera: Conjunctivae normal.     Pupils: Pupils are equal, round, and reactive to light.  Cardiovascular:     Rate and Rhythm: Normal rate and regular rhythm.     Pulses: Normal pulses.     Heart sounds: Normal heart sounds.  Comments: 2+ bilateral radial/dorsalis pedis pulses with regular rate Pulmonary:     Effort: Pulmonary effort is normal. No respiratory distress.     Breath sounds: Normal breath sounds.  Abdominal:     Palpations: Abdomen is soft.     Tenderness: There is no abdominal tenderness. There is no guarding or rebound.  Musculoskeletal:        General: Normal range of motion.     Cervical back: Normal range of motion and neck supple.     Comments: 5 out of 5 bilateral grip/leg extension strength  Skin:    General: Skin is warm and dry.     Capillary Refill: Capillary refill takes less than 2 seconds.  Neurological:     General: No focal deficit present.     Mental Status:  He is alert and oriented to person, place, and time.     Sensory: Sensation is intact.     Motor: Motor function is intact.     Coordination: Coordination is intact.     Gait: Gait is intact.     Comments: Sensation intact in all 4 limbs Not actively dizzy Unilateral horizontal fatigable nystagmus Cranial nerves III through XII intact Vision grossly intact No gait abnormalities  Psychiatric:        Mood and Affect: Mood normal.     ED Results / Procedures / Treatments   Labs (all labs ordered are listed, but only abnormal results are displayed) Labs Reviewed  CBC - Abnormal; Notable for the following components:      Result Value   RBC 7.05 (*)    MCV 71.8 (*)    MCH 22.3 (*)    RDW 17.6 (*)    All other components within normal limits  COMPREHENSIVE METABOLIC PANEL - Abnormal; Notable for the following components:   Sodium 132 (*)    Chloride 96 (*)    Glucose, Bld 249 (*)    Creatinine, Ser 1.38 (*)    Total Protein 8.2 (*)    Total Bilirubin 1.3 (*)    GFR, Estimated 51 (*)    All other components within normal limits  URINALYSIS, ROUTINE W REFLEX MICROSCOPIC - Abnormal; Notable for the following components:   Color, Urine COLORLESS (*)    Glucose, UA >=500 (*)    All other components within normal limits    EKG None  Radiology CT Head Wo Contrast  Result Date: 07/19/2023 CLINICAL DATA:  Dizziness for 4 days. EXAM: CT HEAD WITHOUT CONTRAST TECHNIQUE: Contiguous axial images were obtained from the base of the skull through the vertex without intravenous contrast. RADIATION DOSE REDUCTION: This exam was performed according to the departmental dose-optimization program which includes automated exposure control, adjustment of the mA and/or kV according to patient size and/or use of iterative reconstruction technique. COMPARISON:  CT head without contrast 06/07/2022 FINDINGS: Brain: No acute infarct, hemorrhage, or mass lesion is present. Mild periventricular white  matter changes are stable. Slight prominence of extra-axial spaces is chronic. No acute hemorrhage is present. The ventricles are of normal size. The brainstem and cerebellum are within normal limits. Vascular: Atherosclerotic changes are present in the cavernous internal carotid arteries bilaterally. No hyperdense vessel is present. Skull: Calvarium is intact. No focal lytic or blastic lesions are present. No significant extracranial soft tissue lesion is present. Sinuses/Orbits: The paranasal sinuses and mastoid air cells are clear. The globes and orbits are within normal limits. IMPRESSION: 1. No acute intracranial abnormality or significant interval change. 2. 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 Electronically Signed   By: Marin Roberts M.D.   On: 07/19/2023 12:48    Procedures Procedures    Medications Ordered in ED Medications - No data to display  ED Course/ Medical Decision Making/ A&P                             Medical Decision Making Amount and/or Complexity of Data Reviewed Labs: ordered. Radiology: ordered.   Acie Orrico 82 y.o. presented today for dizziness. Working DDx that I considered at this time includes, but not limited to, CVA/TIA, arrhythmia, CNS lesion, BPPV, vestibular labyrinthitis, Meniere's, Ramsey-Hunt, polypharmacy, orthostatic hypotension, electrolyte abnormalities.  R/o DDx: CVA/TIA, arrhythmia, CNS lesion, BPPV, Meniere's, Ramsey-Hunt, polypharmacy, orthostatic hypotension, electrolyte abnormalities: These are considered less likely due to history of present illness and physical exam findings  Review of prior external notes: 06/06/2022 ED  Unique Tests and My Interpretation:  EKG: Sinus 57 bpm, no ST elevations or depressions noted, no blocks noted CBC: Unremarkable BMP: Unremarkable UA: Unremarkable CT Head w/o Contrast: No acute changes  Discussion with  Independent Historian: None  Discussion of Management of Tests: None  Risk: Low: based on diagnostic testing/clinical impression and treatment plan  Risk Stratification Score: None  Plan: On exam patient was in no acute distress with stable vitals.  Patient had reassuring neurologic exam along with the rest of his physical exam.  Patient's history is suspicious for possible peripheral vertigo with his reported tinnitus and how the vertigo is intermittent.  Patient's physical exam does show horizontal fatigable unilateral nystagmus also suspicious of peripheral vertigo.  Patient CT came back unremarkable along with the rest of his labs.  Patient states that he wants to be discharged and follow-up with his primary care provider.  At this time patient is stable to be discharged.  At this time suspicion of any life-threatening diagnoses.  Patient was given return precautions. Patient stable for discharge at this time.  Patient verbalized understanding of plan.         Final Clinical Impression(s) / ED Diagnoses Final diagnoses:  Peripheral vertigo of both ears    Rx / DC Orders ED Discharge Orders     None         Remi Deter 07/19/23 1501    Melene Plan, DO 07/19/23 1509

## 2023-07-19 NOTE — Discharge Instructions (Addendum)
Please follow-up with your primary care provider with symptoms and ER visit.  Today your labs and imaging are reassuring and you most likely have a benign version of vertigo.  If symptoms change or worsen please return to ER.

## 2023-07-29 ENCOUNTER — Ambulatory Visit (HOSPITAL_COMMUNITY): Payer: Medicare Other

## 2023-07-31 ENCOUNTER — Ambulatory Visit: Payer: Medicare Other | Admitting: Medical

## 2023-08-26 ENCOUNTER — Ambulatory Visit (HOSPITAL_COMMUNITY)
Admission: RE | Admit: 2023-08-26 | Discharge: 2023-08-26 | Disposition: A | Discharge status: Home / Self Care | Payer: Medicare Other | Source: Ambulatory Visit | Attending: Emergency Medicine | Admitting: Emergency Medicine

## 2023-08-26 DIAGNOSIS — R911 Solitary pulmonary nodule: Secondary | ICD-10-CM | POA: Diagnosis present

## 2023-08-31 ENCOUNTER — Other Ambulatory Visit: Payer: Self-pay | Admitting: Internal Medicine

## 2023-08-31 ENCOUNTER — Other Ambulatory Visit: Payer: Self-pay | Admitting: Medical

## 2023-09-03 ENCOUNTER — Other Ambulatory Visit: Payer: Self-pay | Admitting: Medical

## 2023-09-10 ENCOUNTER — Ambulatory Visit: Payer: Medicare Other | Admitting: Internal Medicine

## 2023-09-10 ENCOUNTER — Other Ambulatory Visit: Payer: Self-pay

## 2023-09-10 ENCOUNTER — Telehealth: Payer: Self-pay | Admitting: Internal Medicine

## 2023-09-10 MED ORDER — METOPROLOL TARTRATE 50 MG PO TABS: 50.0000 mg | ORAL_TABLET | Freq: Two times a day (BID) | ORAL | 0 refills | Status: DC

## 2023-09-10 NOTE — Telephone Encounter (Signed)
*  STAT* If patient is at the pharmacy, call can be transferred to refill team.   1. Which medications need to be refilled? (please list name of each medication and dose if known) metoprolol tartrate (LOPRESSOR) 50 MG tablet  2. Which pharmacy/location (including street and city if local pharmacy) is medication to be sent to? [id number] - [place], [place] - [address]  3. Do they need a 30 day or 90 day supply?   90 day supply

## 2023-09-29 ENCOUNTER — Telehealth: Payer: Self-pay | Admitting: Emergency Medicine

## 2023-09-29 NOTE — Telephone Encounter (Signed)
Pls call pt w/CT results. His # is [phone]

## 2023-09-30 NOTE — Telephone Encounter (Signed)
Please let the patient know that his known right upper lobe pulmonary nodule looks similar in size and appearance compared with his priors.  There are other small pulmonary nodules present.  We will need to continue to follow for stability.  I did like for him to have a repeat CT scan of the chest without contrast in 6 months which would be March 2025.

## 2023-09-30 NOTE — Telephone Encounter (Signed)
Super D CT chest w/o contrast. Thanks

## 2023-09-30 NOTE — Telephone Encounter (Signed)
Patient is aware of results and voiced his understanding. He agrees with repeat CT.  Dr. Delton Coombes, would you like CT wo or super D?

## 2023-09-30 NOTE — Telephone Encounter (Signed)
Spoke to patient. He is requesting CT results.  Dr. Delton Coombes, please advise.

## 2023-10-01 NOTE — Telephone Encounter (Signed)
NFN 

## 2023-10-09 ENCOUNTER — Other Ambulatory Visit: Payer: Self-pay | Admitting: Family Medicine

## 2023-10-09 DIAGNOSIS — R220 Localized swelling, mass and lump, head: Secondary | ICD-10-CM

## 2023-10-20 ENCOUNTER — Ambulatory Visit
Admission: RE | Admit: 2023-10-20 | Discharge: 2023-10-20 | Disposition: A | Discharge status: Home / Self Care | Payer: Medicare Other | Source: Ambulatory Visit | Attending: Family Medicine | Admitting: Family Medicine

## 2023-10-20 DIAGNOSIS — R220 Localized swelling, mass and lump, head: Secondary | ICD-10-CM

## 2023-11-01 NOTE — Progress Notes (Unsigned)
Cardiology Office Note:  .   Date:  11/03/2023  ID:  [place], DOB 07-23-1941, MRN [id number] PCP: [name], MD  [facility] Providers Cardiologist:  Yvonne Kendall, MD     History of Present Illness: .   Ronald Ramirez is a 82 y.o. male with history of atypical chest pain, hypertension, hyperlipidemia, type 2 diabetes mellitus, and anxiety, who presents for follow-up of chest pain.  He was last seen in our office in 07/2022 by Cadence Furth, PA, at which time he continued to have occasional chest pain.  He was started on antianginal therapy with isosorbide mononitrate 15 mg daily, given preceding MPI in 2022 being normal.  73-month follow-up was recommended, though he was lost to follow-up until today.  Today, Ronald Ramirez reports that he has been feeling fairly well.  His main complaint is of palpitations that occur only when he turns onto his left side at night.  He feels like his heart is "not pumping the way it is supposed to."  If he rolls onto his right side or his back, the symptoms promptly resolved.  He describes the sensation as skipped heartbeats.  It does not happen every time he lies on his left side.  This has been present for at least a year and is not getting any worse.  Sometimes feels a little dizzy when the palpitations are occurring.  He describes it as a vertigo sensation for which she has been seen in the ED.  He was advised that it could be due to an inner ear problem.  He has not had any falls or syncope.  He denies chest pain, shortness of breath, and edema.  ROS: See HPI  Studies Reviewed: Marland Kitchen   EKG Interpretation Date/Time:  Monday November 02 2023 16:33:28 EST Ventricular Rate:  68 PR Interval:  184 QRS Duration:  94 QT Interval:  400 QTC Calculation: 425 R Axis:   -20  Text Interpretation: Normal sinus rhythm Nonspecific T wave abnormality Abnormal ECG When compared with ECG of 19-Jul-2023 13:25, HEART RATE has increased Otherwise no  significant change Confirmed by Jessamy Torosyan, Cristal Deer [phone]) on 11/03/2023 9:20:37 PM    Risk Assessment/Calculations:             Physical Exam:   VS:  BP 130/82 (BP Location: Left Arm, Patient Position: Sitting, Cuff Size: Normal)   Pulse 68   Ht 6' (1.829 m)   Wt 192 lb 12.8 oz (87.5 kg)   SpO2 95%   BMI 26.15 kg/m    Wt Readings from Last 3 Encounters:  11/02/23 192 lb 12.8 oz (87.5 kg)  07/19/23 196 lb (88.9 kg)  02/17/23 201 lb 9.6 oz (91.4 kg)    General:  NAD. Neck: No JVD or HJR. Lungs: Clear to auscultation bilaterally without wheezes or crackles. Heart: Regular rate and rhythm without murmurs, rubs, or gallops. Abdomen: Soft, nontender, nondistended. Extremities: No lower extremity edema.  ASSESSMENT AND PLAN: .    Palpitations: The seem positional, mostly Ronald Ramirez is lying in the left lateral decubitus position.  His EKG today does not show any arrhythmias.  We discussed repeating an event monitor but have agreed to defer this at the patient's request.  Atypical chest pain: No recurrence of pain.  Prior MPI unrevealing.  No further workup at this time.  Dizziness: Symptoms thought to be related to vertigo during prior ED visit.  Some dizziness also occurs with aforementioned palpitations.  Description is not consistent with orthostatic  lightheadedness.  If symptoms persist, ambulatory cardiac monitoring and ENT consultation may be helpful.  Hypertension: Blood pressure borderline elevated today.  Continue current regimen of HCTZ, losartan, metoprolol succinate, and isosorbide mononitrate.    Dispo: Return to clinic in 2 months to reassess dizziness and palpitations.  Signed, Yvonne Kendall, MD

## 2023-11-02 ENCOUNTER — Ambulatory Visit: Payer: Medicare Other | Attending: Internal Medicine | Admitting: Internal Medicine

## 2023-11-02 ENCOUNTER — Encounter: Payer: Self-pay | Admitting: Internal Medicine

## 2023-11-02 VITALS — BP 130/82 | HR 68 | Ht 72.0 in | Wt 192.8 lb

## 2023-11-02 DIAGNOSIS — I10 Essential (primary) hypertension: Secondary | ICD-10-CM | POA: Diagnosis not present

## 2023-11-02 DIAGNOSIS — R002 Palpitations: Secondary | ICD-10-CM | POA: Diagnosis not present

## 2023-11-02 DIAGNOSIS — R0789 Other chest pain: Secondary | ICD-10-CM

## 2023-11-02 DIAGNOSIS — R42 Dizziness and giddiness: Secondary | ICD-10-CM

## 2023-11-02 NOTE — Patient Instructions (Signed)
Medication Instructions:  Your Physician recommend you continue on your current medication as directed.    *If you need a refill on your cardiac medications before your next appointment, please call your pharmacy*   Lab Work: None ordered today.  If you have labs (blood work) drawn today and your tests are completely normal, you will receive your results only by: MyChart Message (if you have MyChart) OR A paper copy in the mail If you have any lab test that is abnormal or we need to change your treatment, we will call you to review the results.   Testing/Procedures: None ordered today.   Follow-Up: At [facility], you and your health needs are our priority.  As part of our continuing mission to provide you with exceptional heart care, we have created designated Provider Care Teams.  These Care Teams include your primary Cardiologist (physician) and Advanced Practice Providers (APPs -  Physician Assistants and Nurse Practitioners) who all work together to provide you with the care you need, when you need it.  We recommend signing up for the patient portal called "MyChart".  Sign up information is provided on this After Visit Summary.  MyChart is used to connect with patients for Virtual Visits (Telemedicine).  Patients are able to view lab/test results, encounter notes, upcoming appointments, etc.  Non-urgent messages can be sent to your provider as well.   To learn more about what you can do with MyChart, go to [email or web address].    Your next appointment:   2 month(s)  Provider:   You may see Yvonne Kendall, MD or one of the following Advanced Practice Providers on your designated Care Team:   Nicolasa Ducking, NP Eula Listen, PA-C Cadence Fransico Michael, PA-C Charlsie Quest, NP Carlos Levering, NP

## 2023-11-03 ENCOUNTER — Telehealth: Payer: Self-pay | Admitting: Internal Medicine

## 2023-11-03 ENCOUNTER — Encounter: Payer: Self-pay | Admitting: Internal Medicine

## 2023-11-03 DIAGNOSIS — R42 Dizziness and giddiness: Secondary | ICD-10-CM | POA: Insufficient documentation

## 2023-11-03 NOTE — Telephone Encounter (Signed)
Called to schedule 2 month follow up per checkout 11/02/23 No answer, no VM

## 2024-01-06 ENCOUNTER — Encounter: Payer: Self-pay | Admitting: Medical

## 2024-01-06 ENCOUNTER — Ambulatory Visit: Payer: Medicare Other | Attending: Medical | Admitting: Medical

## 2024-01-06 VITALS — BP 122/78 | HR 61 | Ht 72.0 in | Wt 196.8 lb

## 2024-01-06 DIAGNOSIS — R002 Palpitations: Secondary | ICD-10-CM

## 2024-01-06 DIAGNOSIS — E785 Hyperlipidemia, unspecified: Secondary | ICD-10-CM

## 2024-01-06 DIAGNOSIS — I10 Essential (primary) hypertension: Secondary | ICD-10-CM | POA: Diagnosis not present

## 2024-01-06 DIAGNOSIS — R0789 Other chest pain: Secondary | ICD-10-CM

## 2024-01-06 DIAGNOSIS — R42 Dizziness and giddiness: Secondary | ICD-10-CM

## 2024-01-06 MED ORDER — HYDROCHLOROTHIAZIDE 25 MG PO TABS: 25.0000 mg | ORAL_TABLET | Freq: Every day | ORAL | 0 refills | Status: DC

## 2024-01-06 NOTE — Patient Instructions (Signed)
 Medication Instructions:   Your physician recommends that you continue on your current medications as directed. Please refer to the Current Medication list given to you today.  *If you need a refill on your cardiac medications before your next appointment, please call your pharmacy*   Lab Work: Your provider would like for you to have the following labs drawn: TODAY.   LIPID/ DIRECT LDL   Please go to [facility] [address] [address], [place] [address] You do not need an appointment.  They are open from 8 am- 4:30 pm.  Lunch from 1:00 pm- 2:00 pm  If you have labs (blood work) drawn today and your tests are completely normal, you will receive your results only by: MyChart Message (if you have MyChart) OR A paper copy in the mail If you have any lab test that is abnormal or we need to change your treatment, we will call you to review the results.   Testing/Procedures: NONE   Follow-Up: At [facility], you and your health needs are our priority.  As part of our continuing mission to provide you with exceptional heart care, we have created designated Provider Care Teams.  These Care Teams include your primary Cardiologist (physician) and Advanced Practice Providers (APPs -  Physician Assistants and Nurse Practitioners) who all work together to provide you with the care you need, when you need it.  We recommend signing up for the patient portal called "MyChart".  Sign up information is provided on this After Visit Summary.  MyChart is used to connect with patients for Virtual Visits (Telemedicine).  Patients are able to view lab/test results, encounter notes, upcoming appointments, etc.  Non-urgent messages can be sent to your provider as well.   To learn more about what you can do with MyChart, go to [email or web address].    Your next appointment:   4 month(s)  Provider:   You may see Sammy Crisp, MD or one of the following  Advanced Practice Providers on your designated Care Team:   Laneta Pintos, NP Varney Gentleman, PA-C Cadence Gennaro Khat, PA-C Ronald Cockayne, NP Morey Ar, NP

## 2024-01-06 NOTE — Progress Notes (Signed)
Cardiology Office Note:    Date:  01/06/2024   ID:  [place], DOB 11-21-1941, MRN [id number]  PCP:  [name], MD  [facility] Cardiologist:  Yvonne Kendall, MD  [facility] Electrophysiologist:  None   Referring MD: [name], MD   Chief Complaint: 3 month follow-up  History of Present Illness:    Ronald Ramirez is a 83 y.o. male with a hx of atypical chest pain, hypertension, hyperlipidemia, type 2 diabetes, and anxiety presents for follow-up.  Myoview Lexiscan in 2022 was overall normal with EF of 55 to 65%, no reversible ischemia, low risk study.  Patient was last seen in November 2024 by Dr. Okey Dupre and was feeling fairly well.  He reported palpitations.  EKG showed normal sinus rhythm with no arrhythmias.  Repeat heart monitor was discussed, but deferred at that time.  Reported dizziness consistent with orthostatic lightheadedness.  Today, the patient has concerns with his BP, says it has been high.  BP today is normal. He has dizziness when he feels stressed. He reports atypical chest pain. Breathing is OK. No significant palpitations. He reports a knot on the side of his head, something like a pustule that is improving. No pain.  Past Medical History:  Diagnosis Date   Anxiety    Chest pain    NUC stress 11/09- overall low risk, no ishemia, normal EF, exercise time 5:01, NSSSTW changes   Diabetes mellitus without complication (HCC)    Hypertension    Occasional tremors    PVC's (premature ventricular contractions)     Past Surgical History:  Procedure Laterality Date   HERNIA REPAIR      Current Medications: Current Meds  Medication Sig   atorvastatin (LIPITOR) 20 MG tablet Take 20 mg by mouth at bedtime.   clonazePAM (KLONOPIN) 1 MG tablet Take 1 mg by mouth daily.   DM-APAP-CPM (CORICIDIN HBP PO) Take by mouth as needed.   insulin NPH-regular Human (70-30) 100 UNIT/ML injection Inject 25 Units into the skin 2 (two) times daily with a  meal.   JARDIANCE 10 MG TABS tablet Take 10 mg by mouth daily.   losartan (COZAAR) 100 MG tablet Take 1 tablet (100 mg total) by mouth daily.   [DISCONTINUED] hydrochlorothiazide (HYDRODIURIL) 25 MG tablet Take 1 tablet (25 mg total) by mouth daily. PLEASE KEEP SCHEDULED OFFICE VISIT FOR FURTHER REFILLS. THANK YOU!     Allergies:   Lantus [insulin glargine] and Metformin and related   Social History   Socioeconomic History   Marital status: Widowed    Spouse name: Not on file   Number of children: Not on file   Years of education: Not on file   Highest education level: Not on file  Occupational History   Not on file  Tobacco Use   Smoking status: Never   Smokeless tobacco: Never  Vaping Use   Vaping status: Never Used  Substance and Sexual Activity   Alcohol use: No   Drug use: No   Sexual activity: Not on file  Other Topics Concern   Not on file  Social History Narrative   Lives alone    Right handed   Caffeine: none    Social Drivers of Health   Financial Resource Strain: Not on file  Food Insecurity: Not on file  Transportation Needs: Not on file  Physical Activity: Not on file  Stress: Not on file  Social Connections: Not on file     Family History: The  patient's family history includes Bleeding Disorder in his sister; Diabetes in his sister; Stroke in his son.  ROS:   Please see the history of present illness.     All other systems reviewed and are negative.  EKGs/Labs/Other Studies Reviewed:    The following studies were reviewed today:  Myoview lexiscan 2022  The left ventricular ejection fraction is normal (55-65%). Nuclear stress EF: 63%. No T wave inversion was noted during stress. There was no ST segment deviation noted during stress. The study is normal. This is a low risk study.   No reversible ischemia. LVEF 63% with normal wall motion. This a low risk study. Compared to a prior study in 2018, this study demonstrates no perfusion  defects.    EKG:  EKG is  not ordered today.  Recent Labs: 07/19/2023: ALT 28; BUN 23; Creatinine, Ser 1.38; Hemoglobin 15.7; Platelets 164; Potassium 3.7; Sodium 132  Recent Lipid Panel    Component Value Date/Time   CHOL 96 (L) 06/01/2018 1001   TRIG 101 06/01/2018 1001   HDL 38 (L) 06/01/2018 1001   CHOLHDL 2.5 06/01/2018 1001   LDLCALC 38 06/01/2018 1001     Physical Exam:    VS:  BP 122/78 (BP Location: Left Arm, Patient Position: Sitting, Cuff Size: Normal)   Pulse 61   Ht 6' (1.829 m)   Wt 196 lb 12.8 oz (89.3 kg)   SpO2 97%   BMI 26.69 kg/m     Wt Readings from Last 3 Encounters:  01/06/24 196 lb 12.8 oz (89.3 kg)  11/02/23 192 lb 12.8 oz (87.5 kg)  07/19/23 196 lb (88.9 kg)     GEN:  Well nourished, well developed in no acute distress HEENT: Normal NECK: No JVD; No carotid bruits LYMPHATICS: No lymphadenopathy CARDIAC: RRR, no murmurs, rubs, gallops RESPIRATORY:  Clear to auscultation without rales, wheezing or rhonchi  ABDOMEN: Soft, non-tender, non-distended MUSCULOSKELETAL:  No edema; No deformity  SKIN: Warm and dry NEUROLOGIC:  Alert and oriented x 3 PSYCHIATRIC:  Normal affect   ASSESSMENT:    1. Palpitations   2. Atypical chest pain   3. Dizziness   4. Essential hypertension   5. Hyperlipidemia, unspecified hyperlipidemia type    PLAN:    In order of problems listed above:  Palpitations He denies further palpitations. No plan for heart monitor at this time.   Chest pain He reports atypical chest pain. MPI in 2022 was unremarkable. Patient would like to monitor symptoms at this time.   Dizziness He reports dizziness when is is stressed, otherwise it is not significant. He is following with PCP.   HTN BP is good today, continue hydrochlorothiazide and Losartan  HLD Update lipids today. Continue Lipitor 20mg  daily.   Disposition: Follow up in 4 month(s) with MD/APP    Signed, Alithea Lapage David Stall, PA-C  01/06/2024 4:33 PM    [facility]

## 2024-01-07 LAB — LIPID PANEL
Chol/HDL Ratio: 4.3 {ratio} (ref 0.0–5.0)
Cholesterol, Total: 138 mg/dL (ref 100–199)
HDL: 32 mg/dL — ABNORMAL LOW (ref >39)
LDL Chol Calc (NIH): 67 mg/dL (ref 0–99)
Triglycerides: 238 mg/dL — ABNORMAL HIGH (ref 0–149)
VLDL Cholesterol Cal: 39 mg/dL (ref 5–40)

## 2024-01-07 LAB — LDL CHOLESTEROL, DIRECT: LDL Direct: 66 mg/dL (ref 0–99)

## 2024-01-11 ENCOUNTER — Telehealth: Payer: Self-pay | Admitting: Medical

## 2024-01-11 ENCOUNTER — Other Ambulatory Visit: Payer: Self-pay | Admitting: Internal Medicine

## 2024-01-11 NOTE — Telephone Encounter (Signed)
The patient called inquiring why metoprolol tartrate 50 mg BID is no longer listed on his medication list. According to the chart review, the patient reported during his last office visit on 01/06/24 that he was not taking the medication. However, the patient stated that he has been taking it and simply needs a refill, as the medication was only prescribed for a 30-day supply.  The request will be forwarded to the PA for approval to restart the medication and send an updated prescription.

## 2024-01-11 NOTE — Telephone Encounter (Signed)
Pt c/o medication issue:  1. Name of Medication: Metoprolol Tartrate 50 mg  2. How are you currently taking this medication (dosage and times per day)? 2 times daily  3. Are you having a reaction (difficulty breathing--STAT)? No  4. What is your medication issue? Pt is requesting a callback regarding him wanting to know why the medication has been stopped and no longer on his current medication list. Please advise.

## 2024-01-12 NOTE — Telephone Encounter (Signed)
Last office visit: 01/06/24 with plan to f/u 4 months. next office visit: 05/05/24  Patient reported at LOV he was not taking Metoprolol so med was dc on med list.  Patient called office on 01/11/24 confirming he is taking the Metoprolol.

## 2024-01-13 NOTE — Telephone Encounter (Signed)
Medication filled on 01/12/24  Furth, Cadence H, PA-C  You19 hours ago (3:10 PM)    Ok to refill

## 2024-02-24 ENCOUNTER — Other Ambulatory Visit: Payer: Self-pay | Admitting: Nephrology

## 2024-02-24 DIAGNOSIS — N1831 Chronic kidney disease, stage 3a: Secondary | ICD-10-CM

## 2024-02-29 ENCOUNTER — Ambulatory Visit
Admission: RE | Admit: 2024-02-29 | Discharge: 2024-02-29 | Disposition: A | Discharge status: Home / Self Care | Source: Ambulatory Visit | Attending: Nephrology

## 2024-02-29 DIAGNOSIS — N1831 Chronic kidney disease, stage 3a: Secondary | ICD-10-CM

## 2024-04-03 ENCOUNTER — Other Ambulatory Visit: Payer: Self-pay | Admitting: Medical

## 2024-04-09 ENCOUNTER — Other Ambulatory Visit: Payer: Self-pay | Admitting: Internal Medicine

## 2024-05-05 ENCOUNTER — Ambulatory Visit: Payer: Medicare Other | Admitting: Medical

## 2024-05-05 NOTE — Progress Notes (Deleted)
  Cardiology Office Note:  .   Date:  05/05/2024  ID:  Ronald Ramirez, DOB [birth year], MRN [id number] PCP: Ulysees Gander, MD  [facility] Providers Cardiologist:  Sammy Crisp, MD { Click to update primary MD,subspecialty MD or APP then REFRESH:1}   History of Present Illness: .   Ronald Ramirez is a 83 y.o. male  with a hx of atypical chest pain, hypertension, hyperlipidemia, type 2 diabetes, and anxiety presents for follow-up.   Myoview  Lexiscan  in 2022 was overall normal with EF of 55 to 65%, no reversible ischemia, low risk study.   Patient was last seen 01/06/2024 reporting concerns with blood pressure.  Blood pressure that day was normal.  She denied any palpitations.  No further workup was pursued.  Today, Blood pressure and palpitations and chest pain  ROS: ***  Studies Reviewed: .        *** Risk Assessment/Calculations:   {Does this patient have ATRIAL FIBRILLATION?:[phone]} No BP recorded.  {Refresh Note OR Click here to enter BP  :1}***       Physical Exam:   VS:  There were no vitals taken for this visit.   Wt Readings from Last 3 Encounters:  01/06/24 196 lb 12.8 oz (89.3 kg)  11/02/23 192 lb 12.8 oz (87.5 kg)  07/19/23 196 lb (88.9 kg)    GEN: Well nourished, well developed in no acute distress NECK: No JVD; No carotid bruits CARDIAC: ***RRR, no murmurs, rubs, gallops RESPIRATORY:  Clear to auscultation without rales, wheezing or rhonchi  ABDOMEN: Soft, non-tender, non-distended EXTREMITIES:  No edema; No deformity   ASSESSMENT AND PLAN: .   ***    {Are you ordering a CV Procedure (e.g. stress test, cath, DCCV, TEE, etc)?   Press F2        :[id number]}  Dispo: ***  Signed, Crixus Mcaulay Rebekah Canada, PA-C

## 2024-05-25 ENCOUNTER — Ambulatory Visit: Attending: Nurse Practitioner | Admitting: Nurse Practitioner

## 2024-05-25 ENCOUNTER — Ambulatory Visit

## 2024-05-25 ENCOUNTER — Encounter: Payer: Self-pay | Admitting: Nurse Practitioner

## 2024-05-25 VITALS — BP 116/68 | HR 75 | Ht 72.0 in | Wt 197.2 lb

## 2024-05-25 DIAGNOSIS — E785 Hyperlipidemia, unspecified: Secondary | ICD-10-CM | POA: Diagnosis not present

## 2024-05-25 DIAGNOSIS — I493 Ventricular premature depolarization: Secondary | ICD-10-CM

## 2024-05-25 DIAGNOSIS — R0609 Other forms of dyspnea: Secondary | ICD-10-CM | POA: Diagnosis not present

## 2024-05-25 DIAGNOSIS — R0789 Other chest pain: Secondary | ICD-10-CM

## 2024-05-25 DIAGNOSIS — N182 Chronic kidney disease, stage 2 (mild): Secondary | ICD-10-CM

## 2024-05-25 DIAGNOSIS — I10 Essential (primary) hypertension: Secondary | ICD-10-CM | POA: Diagnosis not present

## 2024-05-25 DIAGNOSIS — R911 Solitary pulmonary nodule: Secondary | ICD-10-CM

## 2024-05-25 NOTE — Progress Notes (Signed)
 Office Visit    Patient Name: Ronald Ramirez Date of Encounter: 05/25/2024  Primary Care Provider:  Ulysees Gander, MD Primary Cardiologist:  Sammy Crisp, MD  Chief Complaint    83 y.o. male with a h/o chest pain, HTN, DMII, PVCs, DOE, pulmonary nodules, and CKD II, who presents for follow-up related to dyspnea and PVCs.  Past Medical History   Subjective   Past Medical History:  Diagnosis Date   Anxiety    Chest pain    a. 10/2008 MV: Ex time 5:01, low risk w/o ischemia; b. 06/2017 MV: EF 53%, inflat fixed defect, no ischemia->low risk; c. 06/2021 MV: EF 63%, no ischemia->low risk.   CKD (chronic kidney disease), stage II    Diabetes mellitus without complication (HCC)    Hypertension    Occasional tremors    PVC's (premature ventricular contractions)    Past Surgical History:  Procedure Laterality Date   HERNIA REPAIR      Allergies  Allergies  Allergen Reactions   Lantus [Insulin Glargine] Itching   Metformin And Related Diarrhea       History of Present Illness      83 y.o. y/o male with a h/o chest pain, HTN, DMII, PVCs, DOE, pulmonary nodules, and CKD II.  In the setting of a long history of atypical chest pain, he has undergone stress testing multiple times, dating back to 2009.  More recently, he underwent nuclear stress testing in 2018 and more recently July 2022.  On his most recent study, he had an EF of 63% without ischemia or infarct.  In the setting of palpitations, he wore an event monitor in September 2018, which showed sinus rhythm with occasional PVCs which corresponded to his symptoms.  He has been managed with beta-blocker therapy.  In 2023, Ronald Ramirez had a CT of his cervical spine which incidentally showed a right upper lobe nodule.  He was seen by pulmonology and subsequently underwent a repeat CT scan in September 2024, which showed multiple right upper lobe nodules 2 which were new from prior study and measuring up to 6 mm.   Previously seen nodule in 2023 was minimally increased in size.  Recommendation was made for 56-month follow-up.   Ronald Ramirez was last seen in cardiology clinic in January 2025.  He notes that since then, he has had some progression of dyspnea on exertion.  He occasionally notes a skipped heartbeat but is not necessarily bothered by palpitations.  He does not experience chest pain.  He denies PND, orthopnea, dizziness, syncope, edema, or early satiety.  He has multiple PVCs on his ECG today. Objective   Home Medications    Current Outpatient Medications  Medication Sig Dispense Refill   atorvastatin (LIPITOR) 20 MG tablet Take 20 mg by mouth at bedtime.     clonazePAM (KLONOPIN) 1 MG tablet Take 1 mg by mouth daily.     DM-APAP-CPM (CORICIDIN HBP PO) Take by mouth as needed.     hydrochlorothiazide  (HYDRODIURIL ) 25 MG tablet TAKE 1 TABLET (25 MG TOTAL) BY MOUTH DAILY. 90 tablet 0   insulin NPH-regular Human (70-30) 100 UNIT/ML injection Inject 25 Units into the skin 2 (two) times daily with a meal.     JARDIANCE 10 MG TABS tablet Take 10 mg by mouth daily.     losartan  (COZAAR ) 100 MG tablet Take 1 tablet (100 mg total) by mouth daily. 30 tablet 0   metoprolol  tartrate (LOPRESSOR ) 50 MG tablet TAKE 1 TABLET (  50 MG) BY MOUTH IN THE MORNING AND AT BEDTIME 180 tablet 3   gabapentin  (NEURONTIN ) 100 MG capsule Take 1 capsule (100 mg total) by mouth 2 (two) times daily. (Patient not taking: Reported on 05/25/2024) 60 capsule 3   No current facility-administered medications for this visit.     Physical Exam    VS:  BP 116/68 (BP Location: Left Arm, Patient Position: Sitting, Cuff Size: Large)   Pulse 75   Ht 6' (1.829 m)   Wt 197 lb 4 oz (89.5 kg)   SpO2 98%   BMI 26.75 kg/m  , BMI Body mass index is 26.75 kg/m.       GEN: Well nourished, well developed, in no acute distress. HEENT: normal. Neck: Supple, no JVD, carotid bruits, or masses. Cardiac: RRR with occasional ectopy, no murmurs,  rubs, or gallops. No clubbing, cyanosis, edema.  Radials 2+/PT 2+ and equal bilaterally.  Respiratory:  Respirations regular and unlabored, clear to auscultation bilaterally. GI: Soft, nontender, nondistended, BS + x 4. MS: no deformity or atrophy. Skin: warm and dry, no rash. Neuro:  Strength and sensation are intact. Psych: Normal affect.  Accessory Clinical Findings    ECG personally reviewed by me today - EKG Interpretation Date/Time:  Wednesday May 25 2024 15:21:54 EDT Ventricular Rate:  75 PR Interval:  164 QRS Duration:  92 QT Interval:  386 QTC Calculation: 431 R Axis:   -7  Text Interpretation: Sinus rhythm with frequent Premature ventricular complexes When compared with ECG of 02-Nov-2023 16:33, Premature ventricular complexes are now Present Confirmed by Laneta Pintos [phone]) on 05/25/2024 3:36:02 PM  - no acute changes.  Lab Results  Component Value Date   WBC 4.6 07/19/2023   HGB 15.7 07/19/2023   HCT 50.6 07/19/2023   MCV 71.8 (L) 07/19/2023   PLT 164 07/19/2023   Lab Results  Component Value Date   CREATININE 1.38 (H) 07/19/2023   BUN 23 07/19/2023   NA 132 (L) 07/19/2023   K 3.7 07/19/2023   CL 96 (L) 07/19/2023   CO2 26 07/19/2023   Lab Results  Component Value Date   ALT 28 07/19/2023   AST 26 07/19/2023   ALKPHOS 64 07/19/2023   BILITOT 1.3 (H) 07/19/2023   Lab Results  Component Value Date   CHOL 138 01/06/2024   HDL 32 (L) 01/06/2024   LDLCALC 67 01/06/2024   LDLDIRECT 66 01/06/2024   TRIG 238 (H) 01/06/2024   CHOLHDL 4.3 01/06/2024        Assessment & Plan    1.  PVCs: Patient with prior history of palpitations and occasional PVCs on monitoring in 2018.  Today, he is noted to have frequent PVCs accounting for every fourth beat on his ECG.  He occasionally notes a skipped beat but is not necessarily bothered by palpitations at baseline.  He also reports some increase in dyspnea exertion over the past few months.  I am going to  follow-up a basic metabolic panel, magnesium , and TSH, and also arrange for an echocardiogram and 3-day event monitor to quantify PVC burden.  Patient was agreeable with this plan.  2.  Dyspnea on exertion: Patient with more pronounced dyspnea on exertion and reduced exercise tolerance over the past few months.  He has not had chest pain.  He is euvolemic on examination.  In light of frequent PVCs today, arranging for echo and 3-day ZIO monitor.  Further recommendations pending testing.  3.  History of chest pain: Prior history of  chest pain with normal nuclear studies in 2009, 2018, and most recently 2022.  Patient denies chest pain but has had some progression of dyspnea.  Will start with follow-up echo.  Pending results may need to consider ischemic evaluation.  4.  Primary hypertension: Blood pressure is well-controlled today on beta-blocker, ARB, and HCTZ.  Follow-up basic metabolic panel.  5.  Type 2 diabetes mellitus: He is on insulin and Jardiance.  Followed by primary care.  6.  Stage II chronic kidney disease: Creatinine elevated at 1.38 last July.  Follow-up basic metabolic panel today in the setting of PVCs.  He remains on ARB.  7.  Pulmonary nodules: Patient previously found to have right lung nodule in 2023 with follow-up scan in October 2024 showing new right upper lobe pulmonary nodules, 2 of which were new, and some increase in size of prior pulmonary nodule.  CT result recommended follow-up CT in 3 months.  Patient was unaware of this.  I am referring him back to pulmonology for additional evaluation.  8.  Disposition: Follow-up TSH, basic metabolic panel, and magnesium  today.  Follow-up 3-day ZIO monitor and echo.  Refer to pulmonology for pulmonary nodules.  Follow-up in clinic in 1 to 2 months.  Laneta Pintos, NP 05/25/2024, 4:33 PM

## 2024-05-25 NOTE — Patient Instructions (Signed)
 Medication Instructions:  Your physician recommends that you continue on your current medications as directed. Please refer to the Current Medication list given to you today.    *If you need a refill on your cardiac medications before your next appointment, please call your pharmacy*  Lab Work: Your provider would like for you to have following labs drawn today BMeT, Mag, and TSH.   If you have labs (blood work) drawn today and your tests are completely normal, you will receive your results only by: MyChart Message (if you have MyChart) OR A paper copy in the mail If you have any lab test that is abnormal or we need to change your treatment, we will call you to review the results.  Testing/Procedures: Your physician has requested that you have an echocardiogram. Echocardiography is a painless test that uses sound waves to create images of your heart. It provides your doctor with information about the size and shape of your heart and how well your heart's chambers and valves are working.   You may receive an ultrasound enhancing agent through an IV if needed to better visualize your heart during the echo. This procedure takes approximately one hour.  There are no restrictions for this procedure.  This will take place at [address] [facility] [address]) [address], [place] [address]  Please note: We ask at that you not bring children with you during ultrasound (echo/ vascular) testing. Due to room size and safety concerns, children are not allowed in the ultrasound rooms during exams. Our front office staff cannot provide observation of children in our lobby area while testing is being conducted. An adult accompanying a patient to their appointment will only be allowed in the ultrasound room at the discretion of the ultrasound technician under special circumstances. We apologize for any inconvenience.   Your physician has recommended that you wear a Zio monitor.   This monitor is a medical  device that records the heart's electrical activity. Doctors most often use these monitors to diagnose arrhythmias. Arrhythmias are problems with the speed or rhythm of the heartbeat. The monitor is a small device applied to your chest. You can wear one while you do your normal daily activities. While wearing this monitor if you have any symptoms to push the button and record what you felt. Once you have worn this monitor for the period of time provider prescribed (3 days), you will return the monitor device in the postage paid box. Once it is returned they will download the data collected and provide us  with a report which the provider will then review and we will call you with those results. Important tips:  Avoid showering during the first 24 hours of wearing the monitor. Avoid excessive sweating to help maximize wear time. Do not submerge the device, no hot tubs, and no swimming pools. Keep any lotions or oils away from the patch. After 24 hours you may shower with the patch on. Take brief showers with your back facing the shower head.  Do not remove patch once it has been placed because that will interrupt data and decrease adhesive wear time. Push the button when you have any symptoms and write down what you were feeling. Once you have completed wearing your monitor, remove and place into box which has postage paid and place in your outgoing mailbox.  If for some reason you have misplaced your box then call our office and we can provide another box and/or mail it off for you.  Follow-Up: At [facility], you and your health needs are our priority.  As part of our continuing mission to provide you with exceptional heart care, our providers are all part of one team.  This team includes your primary Cardiologist (physician) and Advanced Practice Providers or APPs (Physician Assistants and Nurse Practitioners) who all work together to provide you with the care you need, when you need  it.  Your next appointment:   2 month(s)  Provider:   Sammy Crisp, MD or Laneta Pintos, NP    We recommend signing up for the patient portal called "MyChart".  Sign up information is provided on this After Visit Summary.  MyChart is used to connect with patients for Virtual Visits (Telemedicine).  Patients are able to view lab/test results, encounter notes, upcoming appointments, etc.  Non-urgent messages can be sent to your provider as well.   To learn more about what you can do with MyChart, go to [email or web address].   Other Instructions Referral to Pulmonology for lung nodules

## 2024-05-26 ENCOUNTER — Ambulatory Visit: Payer: Self-pay | Admitting: Nurse Practitioner

## 2024-05-26 LAB — BASIC METABOLIC PANEL WITH GFR
BUN/Creatinine Ratio: 12 (ref 10–24)
BUN: 18 mg/dL (ref 8–27)
CO2: 24 mmol/L (ref 20–29)
Calcium: 9.7 mg/dL (ref 8.6–10.2)
Chloride: 98 mmol/L (ref 96–106)
Creatinine, Ser: 1.47 mg/dL — ABNORMAL HIGH (ref 0.76–1.27)
Glucose: 250 mg/dL — ABNORMAL HIGH (ref 70–99)
Potassium: 4.2 mmol/L (ref 3.5–5.2)
Sodium: 138 mmol/L (ref 134–144)
eGFR: 47 mL/min/{1.73_m2} — ABNORMAL LOW (ref >59)

## 2024-05-26 LAB — MAGNESIUM: Magnesium: 1.8 mg/dL (ref 1.6–2.3)

## 2024-05-26 LAB — TSH: TSH: 3.52 u[IU]/mL (ref 0.450–4.500)

## 2024-06-15 ENCOUNTER — Ambulatory Visit

## 2024-08-03 ENCOUNTER — Ambulatory Visit: Admitting: Nurse Practitioner

## 2024-08-03 ENCOUNTER — Ambulatory Visit

## 2024-08-24 ENCOUNTER — Telehealth: Payer: Self-pay | Admitting: Neurology

## 2024-08-24 ENCOUNTER — Encounter: Payer: Self-pay | Admitting: Neurology

## 2024-08-24 ENCOUNTER — Ambulatory Visit: Admitting: Neurology

## 2024-08-24 VITALS — BP 162/82 | HR 83 | Ht 72.0 in | Wt 195.0 lb

## 2024-08-24 DIAGNOSIS — M542 Cervicalgia: Secondary | ICD-10-CM | POA: Diagnosis not present

## 2024-08-24 DIAGNOSIS — G20C Parkinsonism, unspecified: Secondary | ICD-10-CM

## 2024-08-24 DIAGNOSIS — G44229 Chronic tension-type headache, not intractable: Secondary | ICD-10-CM | POA: Diagnosis not present

## 2024-08-24 NOTE — Telephone Encounter (Signed)
 no auth required sent to GI [phone]

## 2024-08-24 NOTE — Progress Notes (Signed)
 [facility]  PATIENT: Ronald Ramirez DOB: 08/06/1941  REQUESTING CLINICIAN: Maree Leni Edyth DELENA, MD HISTORY FROM: Patient  REASON FOR VISIT: Headaches with head swelling    HISTORICAL  CHIEF COMPLAINT:  Chief Complaint  Patient presents with   RM13/HEADACHES    Pt is here Alone. Pt states that he has headaches, dizziness, and swelling in his face. Pt states that he gets several headaches per week. Pt denies vision problems with his headaches. Pt states that he has some sound sensitivity with his headaches.  Pt states that his face will be swollen in the morning.     HISTORY OF PRESENT ILLNESS:  Discussed the use of AI scribe software for clinical note transcription with the patient, who gave verbal consent to proceed.  Ronald Ramirez is an 83 year old male with history of hypertension, hyperlipidemia, diabetes, neck pain who presents with daily headaches and facial swelling.  He experiences daily headaches primarily located at his temples, ongoing since May. These headaches are accompanied by a sensation of facial swelling, though this is not visibly apparent to others. He recalls a previous hospital visit where he was informed about issues with blood flow to his brain, described as 'blood was too thick going to my brain'.  He has been prescribed 81 mg aspirin, which makes him feel cold, leading to irregular use. He uses BC arthritis powder for headache relief. He does not use ibuprofen despite a previous recommendation. No nausea, vomiting, or sensitivity to light is associated with his headaches. He experiences dizziness, particularly when leaning over, but has not had any falls. He reports taking blood pressure medication.  He has a history of neck and shoulder pain, which occurs intermittently. He previously used gabapentin  for sleep issues, which initially helped but later caused him to wake up after three hours and be unable to return to sleep. He is  currently not taking gabapentin  and reports poor sleep quality.  He lives with his son and daughter-in-law.    OTHER MEDICAL CONDITIONS: Hypertension, hyperlipidemia, Neck pain, Diabetes    REVIEW OF SYSTEMS: Full 14 system review of systems performed and negative with exception of: As noted in the HPI   ALLERGIES: Allergies  Allergen Reactions   Lantus [Insulin Glargine] Itching   Metformin And Related Diarrhea    HOME MEDICATIONS: Outpatient Medications Prior to Visit  Medication Sig Dispense Refill   atorvastatin (LIPITOR) 20 MG tablet Take 20 mg by mouth at bedtime.     DM-APAP-CPM (CORICIDIN HBP PO) Take by mouth as needed.     hydrochlorothiazide  (HYDRODIURIL ) 25 MG tablet TAKE 1 TABLET (25 MG TOTAL) BY MOUTH DAILY. 90 tablet 0   insulin NPH-regular Human (70-30) 100 UNIT/ML injection Inject 25 Units into the skin 2 (two) times daily with a meal.     JARDIANCE 10 MG TABS tablet Take 10 mg by mouth daily.     losartan  (COZAAR ) 100 MG tablet Take 1 tablet (100 mg total) by mouth daily. 30 tablet 0   metoprolol  tartrate (LOPRESSOR ) 50 MG tablet TAKE 1 TABLET (50 MG) BY MOUTH IN THE MORNING AND AT BEDTIME 180 tablet 3   clonazePAM (KLONOPIN) 1 MG tablet Take 1 mg by mouth daily. (Patient not taking: Reported on 08/24/2024)     gabapentin  (NEURONTIN ) 100 MG capsule Take 1 capsule (100 mg total) by mouth 2 (two) times daily. (Patient not taking: Reported on 08/24/2024) 60 capsule 3   No facility-administered medications prior to visit.    PAST MEDICAL  HISTORY: Past Medical History:  Diagnosis Date   Anxiety    Chest pain    a. 10/2008 MV: Ex time 5:01, low risk w/o ischemia; b. 06/2017 MV: EF 53%, inflat fixed defect, no ischemia->low risk; c. 06/2021 MV: EF 63%, no ischemia->low risk.   CKD (chronic kidney disease), stage II    Diabetes mellitus without complication (HCC)    Hypertension    Occasional tremors    PVC's (premature ventricular contractions)     PAST SURGICAL  HISTORY: Past Surgical History:  Procedure Laterality Date   HERNIA REPAIR      FAMILY HISTORY: Family History  Problem Relation Age of Onset   Diabetes Sister    Bleeding Disorder Sister    Stroke Son     SOCIAL HISTORY: Social History   Socioeconomic History   Marital status: Widowed    Spouse name: Not on file   Number of children: Not on file   Years of education: Not on file   Highest education level: Not on file  Occupational History   Not on file  Tobacco Use   Smoking status: Never   Smokeless tobacco: Never  Vaping Use   Vaping status: Never Used  Substance and Sexual Activity   Alcohol use: No   Drug use: No   Sexual activity: Not on file  Other Topics Concern   Not on file  Social History Narrative   Lives alone    Right handed   Caffeine: none    Social Drivers of Health   Financial Resource Strain: Not on file  Food Insecurity: Not on file  Transportation Needs: Not on file  Physical Activity: Not on file  Stress: Not on file  Social Connections: Not on file  Intimate Partner Violence: Not on file    PHYSICAL EXAM  GENERAL EXAM/CONSTITUTIONAL: Vitals:  Vitals:   08/24/24 0902  BP: (!) 162/82  Pulse: 83  SpO2: 98%  Weight: 195 lb (88.5 kg)  Height: 6' (1.829 m)   Body mass index is 26.45 kg/m. Wt Readings from Last 3 Encounters:  08/24/24 195 lb (88.5 kg)  05/25/24 197 lb 4 oz (89.5 kg)  01/06/24 196 lb 12.8 oz (89.3 kg)   Patient is in no distress; well developed, nourished and groomed; neck is supple  MUSCULOSKELETAL: Gait, strength, tone, movements noted in Neurologic exam below  NEUROLOGIC: MENTAL STATUS:      No data to display         awake, alert, oriented to person, place and time recent and remote memory intact normal attention and concentration language fluent, comprehension intact, naming intact fund of knowledge appropriate  CRANIAL NERVE:  2nd, 3rd, 4th, 6th - Visual fields full to confrontation,  extraocular muscles intact, no nystagmus 5th - facial sensation symmetric 7th - facial strength symmetric 8th - hearing intact 9th - palate elevates symmetrically, uvula midline 11th - shoulder shrug symmetric 12th - tongue protrusion midline  MOTOR:  normal bulk and tone, full strength in the BUE, BLE. He has bradykinesia and mild rigidity. He does have a resting tremors   SENSORY:  normal and symmetric to light touch  COORDINATION:  finger-nose-finger, fine finger movements normal  GAIT/STATION:  Slow, magnetic gait not seen    DIAGNOSTIC DATA (LABS, IMAGING, TESTING) - I reviewed patient records, labs, notes, testing and imaging myself where available.  Lab Results  Component Value Date   WBC 4.6 07/19/2023   HGB 15.7 07/19/2023   HCT 50.6 07/19/2023   MCV  71.8 (L) 07/19/2023   PLT 164 07/19/2023      Component Value Date/Time   NA 138 05/25/2024 1621   K 4.2 05/25/2024 1621   CL 98 05/25/2024 1621   CO2 24 05/25/2024 1621   GLUCOSE 250 (H) 05/25/2024 1621   GLUCOSE 249 (H) 07/19/2023 1149   BUN 18 05/25/2024 1621   CREATININE 1.47 (H) 05/25/2024 1621   CALCIUM  9.7 05/25/2024 1621   PROT 8.2 (H) 07/19/2023 1149   PROT 7.2 04/16/2018 1032   ALBUMIN 3.9 07/19/2023 1149   ALBUMIN 3.8 04/16/2018 1032   AST 26 07/19/2023 1149   ALT 28 07/19/2023 1149   ALKPHOS 64 07/19/2023 1149   BILITOT 1.3 (H) 07/19/2023 1149   BILITOT 1.0 04/16/2018 1032   GFRNONAA 51 (L) 07/19/2023 1149   GFRAA 61 04/16/2018 1032   Lab Results  Component Value Date   CHOL 138 01/06/2024   HDL 32 (L) 01/06/2024   LDLCALC 67 01/06/2024   LDLDIRECT 66 01/06/2024   TRIG 238 (H) 01/06/2024   CHOLHDL 4.3 01/06/2024   No results found for: HGBA1C No results found for: VITAMINB12 Lab Results  Component Value Date   TSH 3.520 05/25/2024     ASSESSMENT AND PLAN  83 y.o. year old male with hypertension, hyperlipidemia, diabetes, who is presenting with complaint of  headaches.  Chronic daily headache with associated dizziness and facial swelling Chronic daily headaches located at the temples since May 2025, associated with dizziness and subjective facial swelling. Previous evaluation suggested thick blood flow to the brain. Currently taking BC arthritis powder, which contains high aspirin content and poses a risk for gastrointestinal issues. No nausea, vomiting, or photophobia reported.  - Order MRI of the head to evaluate for structural causes of headache. - Order MRV to assess blood flow and rule out vascular abnormalities. - Will obtain ESR, CRP - Advise to restart 81 mg aspirin at night to mitigate cold sensation. - Recommend switching from [facility] powder to Aleve  or Tylenol for headache management.  Parkinsonism Exhibits symptoms suggestive of Parkinsonism, including slow movements and tremors. - Monitor symptoms for progression. - Advise to report worsening symptoms, such as increased tremors or difficulty with daily activities.  Chronic neck and shoulder pain Intermittent neck and shoulder pain persists, though less frequent than before.     1. Chronic tension-type headache, not intractable   2. Parkinsonism, unspecified Parkinsonism type (HCC)   3. Neck pain      Patient Instructions  Will obtain CRP and ESR MRI brain without contrast MRA head, I will contact you to go over the results Continue your other medications Please contact me if the tremors, difficulty with movement get worse, or if you are having falls. Return in 6 to 8 months or sooner if worse.  Orders Placed This Encounter  Procedures   MR BRAIN WO CONTRAST   MR ANGIO HEAD WO CONTRAST   C-reactive Protein   Sedimentation Rate    No orders of the defined types were placed in this encounter.   Return in about 6 months (around 02/21/2025).    Pastor Falling, MD 08/24/2024, 4:45 PM  [facility] [address] [place], [place] [address] [phone]

## 2024-08-24 NOTE — Patient Instructions (Addendum)
 Will obtain CRP and ESR MRI brain without contrast MRA head, I will contact you to go over the results Continue your other medications Please contact me if the tremors, difficulty with movement get worse, or if you are having falls. Return in 6 to 8 months or sooner if worse.

## 2024-08-25 LAB — C-REACTIVE PROTEIN: CRP: 1 mg/L (ref 0–10)

## 2024-08-25 LAB — SEDIMENTATION RATE: Sed Rate: 45 mm/h — ABNORMAL HIGH (ref 0–30)

## 2024-08-30 ENCOUNTER — Telehealth: Payer: Self-pay | Admitting: Cardiovascular Disease

## 2024-08-30 ENCOUNTER — Ambulatory Visit: Payer: Self-pay | Admitting: Neurology

## 2024-08-30 ENCOUNTER — Other Ambulatory Visit: Payer: Self-pay | Admitting: Neurology

## 2024-08-30 DIAGNOSIS — G44229 Chronic tension-type headache, not intractable: Secondary | ICD-10-CM

## 2024-08-30 MED ORDER — HYDROCHLOROTHIAZIDE 25 MG PO TABS: 25.0000 mg | ORAL_TABLET | Freq: Every day | ORAL | 2 refills | Status: DC

## 2024-08-30 MED ORDER — OMEPRAZOLE 40 MG PO CPDR: 40.0000 mg | DELAYED_RELEASE_CAPSULE | Freq: Every day | ORAL | 3 refills | Status: DC

## 2024-08-30 MED ORDER — PREDNISONE 20 MG PO TABS: 40.0000 mg | ORAL_TABLET | Freq: Every day | ORAL | 3 refills | Status: DC

## 2024-08-30 NOTE — Telephone Encounter (Signed)
*  STAT* If patient is at the pharmacy, call can be transferred to refill team.   1. Which medications need to be refilled? (please list name of each medication and dose if known) hydrochlorothiazide  (HYDRODIURIL ) 25 MG tablet [[id number]]    2. Would you like to learn more about the convenience, safety, & potential cost savings by using the [facility]?na  3. Are you open to using the [facility] (Type [facility]. Na    4. Which pharmacy/location (including street and city if local pharmacy) is medication to be sent to? [facility] on [address] [place]    5. Do they need a 30 day or 90 day supply? 90

## 2024-08-30 NOTE — Progress Notes (Signed)
 Discussed with patient, informed him of elevated ESR. Will start him on Prednisone  and Prilosec and I will refer him to vascular surgery for Temporal artery biopsy.   Dr. Zacory Fiola

## 2024-08-30 NOTE — Telephone Encounter (Signed)
 Rx sent in

## 2024-09-01 NOTE — Progress Notes (Unsigned)
 Patient ID: Ronald Ramirez, male   DOB: December 20, 1941, 83 y.o.   MRN: [id number]  Reason for Consult: No chief complaint on file.   Referred by Gregg Lek, MD  Subjective:     HPI Ronald Ramirez is a 83 y.o. male presenting for evaluation of headaches.  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  He has been started on prednisone . ***  Past Medical History:  Diagnosis Date   Anxiety    Chest pain    a. 10/2008 MV: Ex time 5:01, low risk w/o ischemia; b. 06/2017 MV: EF 53%, inflat fixed defect, no ischemia->low risk; c. 06/2021 MV: EF 63%, no ischemia->low risk.   CKD (chronic kidney disease), stage II    Diabetes mellitus without complication (HCC)    Hypertension    Occasional tremors    PVC's (premature ventricular contractions)    Family History  Problem Relation Age of Onset   Diabetes Sister    Bleeding Disorder Sister    Stroke Son    Past Surgical History:  Procedure Laterality Date   HERNIA REPAIR      Short Social History:  Social History   Tobacco Use   Smoking status: Never   Smokeless tobacco: Never  Substance Use Topics   Alcohol use: No    Allergies  Allergen Reactions   Lantus [Insulin Glargine] Itching   Metformin And Related Diarrhea    Current Outpatient Medications  Medication Sig Dispense Refill   atorvastatin (LIPITOR) 20 MG tablet Take 20 mg by mouth at bedtime.     DM-APAP-CPM (CORICIDIN HBP PO) Take by mouth as needed.     hydrochlorothiazide  (HYDRODIURIL ) 25 MG tablet Take 1 tablet (25 mg total) by mouth daily. 90 tablet 2   insulin NPH-regular Human (70-30) 100 UNIT/ML injection Inject 25 Units into the skin 2 (two) times daily with a meal.     JARDIANCE 10 MG TABS tablet Take 10 mg by mouth daily.     losartan  (COZAAR ) 100 MG tablet Take 1 tablet (100 mg total) by mouth daily. 30 tablet 0   metoprolol  tartrate (LOPRESSOR ) 50 MG tablet TAKE 1 TABLET (50 MG) BY MOUTH IN THE  MORNING AND AT BEDTIME 180 tablet 3   omeprazole  (PRILOSEC) 40 MG capsule Take 1 capsule (40 mg total) by mouth daily. 30 capsule 3   predniSONE  (DELTASONE ) 20 MG tablet Take 2 tablets (40 mg total) by mouth daily with breakfast. 60 tablet 3   No current facility-administered medications for this visit.    REVIEW OF SYSTEMS  All other systems were reviewed and are negative     Objective:  Objective   There were no vitals filed for this visit. There is no height or weight on file to calculate BMI.  Physical Exam General: no acute distress Cardiac: hemodynamically stable Pulm: normal work of breathing Abdomen: non-tender, no pulsatile mass*** Neuro: alert, no focal deficit Extremities: no edema, cyanosis or wounds*** Vascular:   Right: ***  Left: ***  Data: ESR 45  CRP 1  BMP reviewed, creatinine 1.47     Assessment/Plan:   Ronald Ramirez is a 83 y.o. male with ***  Recommendations to optimize cardiovascular risk: Abstinence from all tobacco products. Blood glucose control with goal A1c < 7%. Blood pressure control with goal blood pressure < 140/90 mmHg. Lipid reduction therapy with goal LDL-C <100 mg/dL  Aspirin 81mg  PO QD.  Atorvastatin 40-80mg  PO QD (or  other high intensity statin therapy).   Ronald GORMAN Serve MD [facility]

## 2024-09-02 ENCOUNTER — Other Ambulatory Visit: Payer: Self-pay | Admitting: Neurology

## 2024-09-02 ENCOUNTER — Ambulatory Visit: Attending: Vascular Surgery | Admitting: Vascular Surgery

## 2024-09-02 ENCOUNTER — Encounter: Payer: Self-pay | Admitting: Vascular Surgery

## 2024-09-02 VITALS — BP 163/92 | HR 74 | Ht 72.0 in | Wt 194.0 lb

## 2024-09-02 DIAGNOSIS — R519 Headache, unspecified: Secondary | ICD-10-CM

## 2024-09-06 ENCOUNTER — Other Ambulatory Visit

## 2024-09-14 ENCOUNTER — Ambulatory Visit: Attending: Nurse Practitioner

## 2024-09-14 ENCOUNTER — Encounter: Payer: Self-pay | Admitting: Nurse Practitioner

## 2024-09-14 ENCOUNTER — Ambulatory Visit: Admitting: Nurse Practitioner

## 2024-09-14 VITALS — BP 130/70 | HR 61 | Ht 72.0 in | Wt 190.0 lb

## 2024-09-14 DIAGNOSIS — R0609 Other forms of dyspnea: Secondary | ICD-10-CM | POA: Diagnosis not present

## 2024-09-14 DIAGNOSIS — R911 Solitary pulmonary nodule: Secondary | ICD-10-CM | POA: Diagnosis not present

## 2024-09-14 DIAGNOSIS — N182 Chronic kidney disease, stage 2 (mild): Secondary | ICD-10-CM

## 2024-09-14 DIAGNOSIS — I10 Essential (primary) hypertension: Secondary | ICD-10-CM

## 2024-09-14 DIAGNOSIS — I493 Ventricular premature depolarization: Secondary | ICD-10-CM | POA: Diagnosis not present

## 2024-09-14 DIAGNOSIS — R0789 Other chest pain: Secondary | ICD-10-CM

## 2024-09-14 LAB — ECHOCARDIOGRAM COMPLETE
AR max vel: 2.04 cm2
AV Area VTI: 2.19 cm2
AV Area mean vel: 1.89 cm2
AV Mean grad: 5 mmHg
AV Peak grad: 8.4 mmHg
Ao pk vel: 1.45 m/s
Area-P 1/2: 2.56 cm2

## 2024-09-14 MED ORDER — ATORVASTATIN CALCIUM 10 MG PO TABS: 10.0000 mg | ORAL_TABLET | Freq: Every day | ORAL | 3 refills | Status: AC

## 2024-09-14 NOTE — Patient Instructions (Signed)
 Medication Instructions:  Your physician recommends that you continue on your current medications as directed. Please refer to the Current Medication list given to you today.    *If you need a refill on your cardiac medications before your next appointment, please call your pharmacy*  Lab Work: No labs ordered today    Testing/Procedures: No test ordered today   Follow-Up: At [facility], you and your health needs are our priority.  As part of our continuing mission to provide you with exceptional heart care, our providers are all part of one team.  This team includes your primary Cardiologist (physician) and Advanced Practice Providers or APPs (Physician Assistants and Nurse Practitioners) who all work together to provide you with the care you need, when you need it.  Your next appointment:   6 month(s)  Provider:   Lonni Hanson, MD

## 2024-09-14 NOTE — Progress Notes (Signed)
 Office Visit    Patient Name: Ronald Ramirez Date of Encounter: 09/14/2024  Primary Care Provider:  Maree Leni Edyth DELENA, MD Primary Cardiologist:  Ronald Hanson, MD    Chief Complaint    83 y.o. male  with a h/o chest pain, HTN, DMII, PVCs, DOE, pulmonary nodules, and CKD II, who presents for follow-up related to dyspnea and PVCs.   Past Medical History   Subjective   Past Medical History:  Diagnosis Date   Anxiety    Chest pain    a. 10/2008 MV: Ex time 5:01, low risk w/o ischemia; b. 06/2017 MV: EF 53%, inflat fixed defect, no ischemia->low risk; c. 06/2021 MV: EF 63%, no ischemia->low risk.   CKD (chronic kidney disease), stage II    Diabetes mellitus without complication (HCC)    Hypertension    Occasional tremors    PVC's (premature ventricular contractions)    Past Surgical History:  Procedure Laterality Date   HERNIA REPAIR      Allergies  Allergies  Allergen Reactions   Lantus [Insulin Glargine] Itching   Metformin And Related Diarrhea       History of Present Illness      83 y.o. y/o male with a h/o chest pain, HTN, DMII, PVCs, DOE, pulmonary nodules, and CKD II.  In the setting of a long history of atypical chest pain, he has undergone stress testing multiple times, dating back to 2009.  More recently, he underwent nuclear stress testing in July 2022.  On that study, he had an EF of 63% without ischemia or infarct.  In the setting of palpitations, he wore an event monitor in September 2018, which showed sinus rhythm with occasional PVCs which corresponded to his symptoms.  He has been managed with beta-blocker therapy.  In 2023, Ronald Ramirez had a CT of his cervical spine which incidentally showed a right upper lobe nodule.  He was seen by pulmonology and subsequently underwent a repeat CT scan in September 2024, which showed multiple right upper lobe nodules 2 which were new from prior study and measuring up to 6 mm.  Previously seen nodule in 2023 was  minimally increased in size.  Recommendation was made for 38-month follow-up.     At June 2025 office visit, Ronald Ramirez complained of progressive dyspnea on exertion with frequent PVCs on ECG.  Lab work was performed and was unremarkable.  He was scheduled for an echocardiogram and event monitor.  He indicates today that he wore the event monitor for 3 days however, we do not have any results.  Upon reviewing the information available on the [employer], Ronald Ramirez never activated the device and therefore, no data is available.  He just had his echocardiogram this morning and those results are pending.  Over the past several months, he has been feeling well.  He says that he has not been experiencing any chest pain or dyspnea and also denies palpitations, PND, orthopnea, dizziness, syncope, edema, or early satiety.  He has been having headaches and was seen by neurology and is now pending MRI.  His sed rate was elevated he was placed on prednisone  and also referred to vascular surgery.  Decision was made not to pursue temporal artery biopsy.   Objective   Home Medications    Current Outpatient Medications  Medication Sig Dispense Refill   aspirin EC (ASPIRIN 81) 81 MG tablet Take 81 mg by mouth daily. Swallow whole.     DM-APAP-CPM (CORICIDIN HBP PO) Take  by mouth as needed.     hydrochlorothiazide  (HYDRODIURIL ) 25 MG tablet Take 1 tablet (25 mg total) by mouth daily. 90 tablet 2   insulin NPH-regular Human (70-30) 100 UNIT/ML injection Inject 25 Units into the skin 2 (two) times daily with a meal.     JARDIANCE 10 MG TABS tablet Take 10 mg by mouth daily.     losartan  (COZAAR ) 100 MG tablet Take 1 tablet (100 mg total) by mouth daily. 30 tablet 0   metoprolol  tartrate (LOPRESSOR ) 50 MG tablet TAKE 1 TABLET (50 MG) BY MOUTH IN THE MORNING AND AT BEDTIME 180 tablet 3   No current facility-administered medications for this visit.     Physical Exam    VS:  BP 130/70 (BP Location: Left  Arm, Patient Position: Sitting, Cuff Size: Normal)   Pulse 61   Ht 6' (1.829 m)   Wt 190 lb (86.2 kg)   SpO2 97%   BMI 25.77 kg/m  , BMI Body mass index is 25.77 kg/m.          GEN: Well nourished, well developed, in no acute distress. HEENT: normal. Neck: Supple, no JVD, carotid bruits, or masses. Cardiac: RRR, no murmurs, rubs, or gallops. No clubbing, cyanosis, edema.  Radials 2+/PT 2+ and equal bilaterally.  Respiratory:  Respirations regular and unlabored, clear to auscultation bilaterally. GI: Soft, nontender, nondistended, BS + x 4. MS: no deformity or atrophy. Skin: warm and dry, no rash. Neuro:  Strength and sensation are intact. Psych: Normal affect.  Accessory Clinical Findings    ECG personally reviewed by me today - EKG Interpretation Date/Time:  Wednesday September 14 2024 10:17:33 EDT Ventricular Rate:  61 PR Interval:  162 QRS Duration:  112 QT Interval:  394 QTC Calculation: 396 R Axis:   -11  Text Interpretation: Normal sinus rhythm T wave abnormality, consider lateral ischemia Confirmed by Ronald Ramirez [phone]) on 09/14/2024 10:27:03 AM  - no acute changes.  Lab Results  Component Value Date   WBC 4.6 07/19/2023   HGB 15.7 07/19/2023   HCT 50.6 07/19/2023   MCV 71.8 (L) 07/19/2023   PLT 164 07/19/2023   Lab Results  Component Value Date   CREATININE 1.47 (H) 05/25/2024   BUN 18 05/25/2024   NA 138 05/25/2024   K 4.2 05/25/2024   CL 98 05/25/2024   CO2 24 05/25/2024   Lab Results  Component Value Date   ALT 28 07/19/2023   AST 26 07/19/2023   ALKPHOS 64 07/19/2023   BILITOT 1.3 (H) 07/19/2023   Lab Results  Component Value Date   CHOL 138 01/06/2024   HDL 32 (L) 01/06/2024   LDLCALC 67 01/06/2024   LDLDIRECT 66 01/06/2024   TRIG 238 (H) 01/06/2024   CHOLHDL 4.3 01/06/2024    Lab Results  Component Value Date   TSH 3.520 05/25/2024       Assessment & Plan    1.  PVCs: Patient with prior history of palpitations and  occasional PVCs on monitoring in 2018.  At his visit in June, he had multiple PVCs on twelve-lead ECG and will arrange for a 3-day ZIO monitor.  Patient says he wore the monitor and mailed it back over, he never activated the device and therefore, no data is available.  He denies any palpitations today and has no PVCs on EKG and no ectopy noted on examination.  We collectively agreed to defer further monitoring at this time.  He did have an echocardiogram this morning  and the read on that is currently pending.  2.  Dyspnea on exertion: Patient complained of dyspnea on exertion at his last visit though notes that this is overall improved.  Echo was just performed this morning and those results are pending.  Further recommendations following echo results.  He is euvolemic on examination and his heart rate and blood pressure are stable.  3.  History of chest pain: Prior history of chest pain with normal nuclear studies in 2009, 2018, and most recently 2022.  He has not been having any chest pain over the past several months and denies dyspnea.  His ECG is notable for more pronounced lateral T wave inversion, which was last seen on 2022 ECG.  Echocardiogram performed this morning and is currently pending.  Provided that this is unchanged from prior echoes, no further ischemic eval planned.  4.  Primary hypertension: Blood pressure stable today on beta-blocker, ARB, and diuretic therapy.  5.  Type 2 diabetes mellitus: On insulin and Jardiance and followed by primary care.  6.  Stage II chronic kidney disease: Creatinine of 1.47 in June 2025.  He is now being followed by nephrology.  He is on ARB and Jardiance.  7.  Pulmonary nodules: Previously found to have right lung nodule 2023 with follow-up scan October 2024 showing right upper lobe pulmonary nodules, 2 of which were new, and some increase in size of prior pulmonary nodule.  Pulmonology recommended follow-up CT in March 2025 however, patient never  followed through.  I referred him back to pulmonology at his last visit however, appointment has not been set yet.  Advised patient to follow-up with Dr. Shelah as previously planned and I have placed a pulmonology referral to ensure better continuity of care.  8.  Headaches: Being evaluated by neurology with MRI pending.  Also seen by vascular surgery due due to elevated ESR and possible concern for temporal arteritis.  Biopsy deferred as patient already on steroids.  Not taking Klonopin and prednisone .  9.  Disposition: Follow-up echo results once available.  Follow-up in 6 months or sooner if necessary.  Ronald Meager, NP 09/14/2024, 10:27 AM

## 2024-09-19 ENCOUNTER — Other Ambulatory Visit

## 2024-09-20 ENCOUNTER — Encounter (HOSPITAL_BASED_OUTPATIENT_CLINIC_OR_DEPARTMENT_OTHER): Payer: Self-pay | Admitting: Emergency Medicine

## 2024-09-20 ENCOUNTER — Emergency Department (HOSPITAL_BASED_OUTPATIENT_CLINIC_OR_DEPARTMENT_OTHER)
Admission: EM | Admit: 2024-09-20 | Discharge: 2024-09-20 | Disposition: A | Discharge status: Home / Self Care | Attending: Emergency Medicine | Admitting: Emergency Medicine

## 2024-09-20 ENCOUNTER — Other Ambulatory Visit: Payer: Self-pay

## 2024-09-20 ENCOUNTER — Emergency Department (HOSPITAL_BASED_OUTPATIENT_CLINIC_OR_DEPARTMENT_OTHER)

## 2024-09-20 DIAGNOSIS — N189 Chronic kidney disease, unspecified: Secondary | ICD-10-CM | POA: Diagnosis not present

## 2024-09-20 DIAGNOSIS — Z7982 Long term (current) use of aspirin: Secondary | ICD-10-CM | POA: Insufficient documentation

## 2024-09-20 DIAGNOSIS — E1165 Type 2 diabetes mellitus with hyperglycemia: Secondary | ICD-10-CM | POA: Diagnosis not present

## 2024-09-20 DIAGNOSIS — G20C Parkinsonism, unspecified: Secondary | ICD-10-CM | POA: Insufficient documentation

## 2024-09-20 DIAGNOSIS — E860 Dehydration: Secondary | ICD-10-CM | POA: Insufficient documentation

## 2024-09-20 DIAGNOSIS — J069 Acute upper respiratory infection, unspecified: Secondary | ICD-10-CM | POA: Insufficient documentation

## 2024-09-20 DIAGNOSIS — I129 Hypertensive chronic kidney disease with stage 1 through stage 4 chronic kidney disease, or unspecified chronic kidney disease: Secondary | ICD-10-CM | POA: Insufficient documentation

## 2024-09-20 DIAGNOSIS — Z794 Long term (current) use of insulin: Secondary | ICD-10-CM | POA: Insufficient documentation

## 2024-09-20 DIAGNOSIS — Z79899 Other long term (current) drug therapy: Secondary | ICD-10-CM | POA: Diagnosis not present

## 2024-09-20 DIAGNOSIS — E1122 Type 2 diabetes mellitus with diabetic chronic kidney disease: Secondary | ICD-10-CM | POA: Diagnosis not present

## 2024-09-20 DIAGNOSIS — R42 Dizziness and giddiness: Secondary | ICD-10-CM | POA: Diagnosis present

## 2024-09-20 LAB — URINALYSIS, MICROSCOPIC (REFLEX): WBC, UA: NONE SEEN WBC/hpf (ref 0–5)

## 2024-09-20 LAB — URINALYSIS, ROUTINE W REFLEX MICROSCOPIC
Bilirubin Urine: NEGATIVE
Glucose, UA: >=500 mg/dL — AB
Hgb urine dipstick: NEGATIVE
Ketones, ur: NEGATIVE mg/dL
Leukocytes,Ua: NEGATIVE
Nitrite: NEGATIVE
Protein, ur: NEGATIVE mg/dL
Specific Gravity, Urine: 1.01 (ref 1.005–1.030)
pH: 6 (ref 5.0–8.0)

## 2024-09-20 LAB — CBC
HCT: 52.5 % — ABNORMAL HIGH (ref 39.0–52.0)
Hemoglobin: 16.9 g/dL (ref 13.0–17.0)
MCH: 22.1 pg — ABNORMAL LOW (ref 26.0–34.0)
MCHC: 32.2 g/dL (ref 30.0–36.0)
MCV: 68.8 fL — ABNORMAL LOW (ref 80.0–100.0)
Platelets: 159 K/uL (ref 150–400)
RBC: 7.63 MIL/uL — ABNORMAL HIGH (ref 4.22–5.81)
RDW: 18.7 % — ABNORMAL HIGH (ref 11.5–15.5)
WBC: 8.2 K/uL (ref 4.0–10.5)
nRBC: 0 % (ref 0.0–0.2)

## 2024-09-20 LAB — CBG MONITORING, ED
Glucose-Capillary: 365 mg/dL — ABNORMAL HIGH (ref 70–99)
Glucose-Capillary: 236 mg/dL — ABNORMAL HIGH (ref 70–99)

## 2024-09-20 LAB — COMPREHENSIVE METABOLIC PANEL WITH GFR
ALT: 52 U/L — ABNORMAL HIGH (ref 0–44)
AST: 37 U/L (ref 15–41)
Albumin: 3.8 g/dL (ref 3.5–5.0)
Alkaline Phosphatase: 85 U/L (ref 38–126)
Anion gap: 13 (ref 5–15)
BUN: 25 mg/dL — ABNORMAL HIGH (ref 8–23)
CO2: 23 mmol/L (ref 22–32)
Calcium: 9.4 mg/dL (ref 8.9–10.3)
Chloride: 96 mmol/L — ABNORMAL LOW (ref 98–111)
Creatinine, Ser: 1.38 mg/dL — ABNORMAL HIGH (ref 0.61–1.24)
GFR, Estimated: 50 mL/min — ABNORMAL LOW (ref >60)
Glucose, Bld: 354 mg/dL — ABNORMAL HIGH (ref 70–99)
Potassium: 4.6 mmol/L (ref 3.5–5.1)
Sodium: 131 mmol/L — ABNORMAL LOW (ref 135–145)
Total Bilirubin: 1.2 mg/dL (ref 0.0–1.2)
Total Protein: 7.5 g/dL (ref 6.5–8.1)

## 2024-09-20 LAB — GROUP A STREP BY PCR: Group A Strep by PCR: NOT DETECTED

## 2024-09-20 LAB — RESP PANEL BY RT-PCR (RSV, FLU A&B, COVID)  RVPGX2
Influenza A by PCR: NEGATIVE
Influenza B by PCR: NEGATIVE
Resp Syncytial Virus by PCR: NEGATIVE
SARS Coronavirus 2 by RT PCR: NEGATIVE

## 2024-09-20 MED ORDER — LACTATED RINGERS IV BOLUS
1000.0000 mL | Freq: Once | INTRAVENOUS | Status: AC
  Administered 2024-09-20: 1000 mL via INTRAVENOUS

## 2024-09-20 NOTE — ED Provider Notes (Signed)
 [place] EMERGENCY DEPARTMENT AT [facility] [place] Provider Note   CSN: [id number] Arrival date & time: 09/20/24  1847     Patient presents with: Weakness and [facility] [place] is a 83 y.o. male.   83 year old male with a history of CKD, diabetes, hypertension, resting tremor (undergoing workup for Parkinson's) who presents to the emergency department with sore throat, hoarseness, and dizziness.  Over the past 3 days has developed the above symptoms.  Also has had a mild cough.  Says he has been feeling more weak than usual.  Has started getting lightheaded when he stands up so decided to come into the emergency department.  Says he does not have any lightheadedness or vertiginous sensation when at rest.       Prior to Admission medications   Medication Sig Start Date End Date Taking? Authorizing Provider  aspirin EC (ASPIRIN 81) 81 MG tablet Take 81 mg by mouth daily. Swallow whole.    [provider]  atorvastatin  (LIPITOR) 10 MG tablet Take 1 tablet (10 mg total) by mouth at bedtime. 09/14/24   Vivienne Lonni Ingle, NP  clonazePAM (KLONOPIN) 1 MG tablet Take 1 mg by mouth as directed.    [provider]  DM-APAP-CPM (CORICIDIN HBP PO) Take by mouth as needed.    [provider]  hydrochlorothiazide  (HYDRODIURIL ) 25 MG tablet Take 1 tablet (25 mg total) by mouth daily. 08/30/24   End, Lonni, MD  insulin NPH-regular Human (70-30) 100 UNIT/ML injection Inject 25 Units into the skin 2 (two) times daily with a meal.    [provider]  JARDIANCE 10 MG TABS tablet Take 10 mg by mouth daily. 11/09/23   [provider]  losartan  (COZAAR ) 100 MG tablet Take 1 tablet (100 mg total) by mouth daily. 09/01/23   End, Lonni, MD  metoprolol  tartrate (LOPRESSOR ) 50 MG tablet TAKE 1 TABLET (50 MG) BY MOUTH IN THE MORNING AND AT BEDTIME 04/11/24   End, Lonni, MD  omeprazole  (PRILOSEC) 40 MG capsule Take 40 mg by mouth daily.     [provider]  predniSONE  (DELTASONE ) 20 MG tablet Take 40 mg by mouth daily with breakfast.    [provider]    Allergies: Lantus [insulin glargine] and Metformin and related    Review of Systems  Updated Vital Signs BP (!) 152/80   Pulse 62   Temp 97.8 F (36.6 C) (Oral)   Resp 18   Ht 6' (1.829 m)   Wt 88.5 kg   SpO2 98%   BMI 26.45 kg/m   Physical Exam Vitals and nursing note reviewed.  Constitutional:      General: He is not in acute distress.    Appearance: He is well-developed.  HENT:     Head: Normocephalic and atraumatic.     Right Ear: External ear normal.     Left Ear: External ear normal.     Nose: Nose normal.     Mouth/Throat:     Mouth: Mucous membranes are moist.     Pharynx: Posterior oropharyngeal erythema present. No oropharyngeal exudate.  Eyes:     Extraocular Movements: Extraocular movements intact.     Conjunctiva/sclera: Conjunctivae normal.     Pupils: Pupils are equal, round, and reactive to light.  Cardiovascular:     Rate and Rhythm: Normal rate and regular rhythm.     Heart sounds: Normal heart sounds.  Pulmonary:     Effort: Pulmonary effort is normal. No respiratory distress.  Breath sounds: Normal breath sounds.  Musculoskeletal:     Cervical back: Normal range of motion and neck supple.     Right lower leg: No edema.     Left lower leg: No edema.  Skin:    General: Skin is warm and dry.  Neurological:     Mental Status: He is alert and oriented to person, place, and time.     Cranial Nerves: No cranial nerve deficit.     Sensory: No sensory deficit.     Motor: No weakness.     Coordination: Coordination normal.     Comments: Fine resting tremor  Psychiatric:        Mood and Affect: Mood normal.        Behavior: Behavior normal.     (all labs ordered are listed, but only abnormal results are displayed) Labs Reviewed  COMPREHENSIVE METABOLIC PANEL WITH GFR - Abnormal; Notable for the following  components:      Result Value   Sodium 131 (*)    Chloride 96 (*)    Glucose, Bld 354 (*)    BUN 25 (*)    Creatinine, Ser 1.38 (*)    ALT 52 (*)    GFR, Estimated 50 (*)    All other components within normal limits  CBC - Abnormal; Notable for the following components:   RBC 7.63 (*)    HCT 52.5 (*)    MCV 68.8 (*)    MCH 22.1 (*)    RDW 18.7 (*)    All other components within normal limits  URINALYSIS, ROUTINE W REFLEX MICROSCOPIC - Abnormal; Notable for the following components:   Glucose, UA >=500 (*)    All other components within normal limits  URINALYSIS, MICROSCOPIC (REFLEX) - Abnormal; Notable for the following components:   Bacteria, UA RARE (*)    All other components within normal limits  CBG MONITORING, ED - Abnormal; Notable for the following components:   Glucose-Capillary 365 (*)    All other components within normal limits  CBG MONITORING, ED - Abnormal; Notable for the following components:   Glucose-Capillary 236 (*)    All other components within normal limits  RESP PANEL BY RT-PCR (RSV, FLU A&B, COVID)  [id number]  GROUP A STREP BY PCR    EKG: None  Radiology: No results found.   Procedures   Medications Ordered in the ED  lactated ringers bolus 1,000 mL (0 mLs Intravenous Stopped 09/20/24 2131)    Clinical Course as of 09/24/24 1059  Tue Sep 20, 2024  2007 Creatinine(!): 1.38 At baseline [RP]    Clinical Course User Index [RP] Yolande Lamar BROCKS, MD                                 Medical Decision Making Amount and/or Complexity of Data Reviewed Labs: ordered. Decision-making details documented in ED Course. Radiology: ordered.   Jibri Ericsson is a 83 year old male with a history of CKD, diabetes, hypertension, resting tremor (undergoing workup for Parkinson's) who presents to the emergency department with sore throat, hoarseness, and dizziness.   Initial Ddx:  Dehydration, orthostasis, URI, pneumonia, electrolyte abnormality, AKI,  Parkinson's/autonomic instability  MDM/Course:  Patient presents to the emergency department with lightheadedness.  This is in the setting of what sounds like some URI symptoms.  He is also undergoing a workup for Parkinson's as well so could be autonomic instability.  Symptoms appear to be consistent  with orthostasis.  COVID and flu negative.  Chest x-ray without pneumonia.  Labs show that he is hyperglycemic but no significant electrolyte derangements or signs of AKI.  Was hyperglycemic but after 1 L bolus blood sugar and improved to 36.  No signs of DKA.  Upon re-evaluation was feeling much improved and no longer was orthostatic.  Instructed to orally hydrate at home and follow-up with his PCP in several days  This patient presents to the ED for concern of complaints listed in HPI, this involves an extensive number of treatment options, and is a complaint that carries with it a high risk of complications and morbidity. Disposition including potential need for admission considered.   Dispo: DC Home. Return precautions discussed including, but not limited to, those listed in the AVS. Allowed pt time to ask questions which were answered fully prior to dc.  Additional history obtained from brother Records reviewed Outpatient Clinic Notes The following labs were independently interpreted: Chemistry and show CKD and Hyperglycemia I independently reviewed the following imaging with scope of interpretation limited to determining acute life threatening conditions related to emergency care: Chest x-ray and agree with the radiologist interpretation with the following exceptions: none I personally reviewed and interpreted cardiac monitoring: normal sinus rhythm  I personally reviewed and interpreted the pt's EKG: see above for interpretation  I have reviewed the patients home medications and made adjustments as needed Social Determinants of health:  Geriatric   Portions of this note were generated with  Scientist, clinical (histocompatibility and immunogenetics). Dictation errors may occur despite best attempts at proofreading.     Final diagnoses:  Dehydration  Upper respiratory tract infection, unspecified type    ED Discharge Orders     None          Yolande Lamar BROCKS, MD 09/24/24 1059

## 2024-09-20 NOTE — Discharge Instructions (Signed)
 You were seen for your upper respiratory tract infection and dizziness in the emergency department.   At home, please stay well-hydrated.    Check your MyChart online for the results of any tests that had not resulted by the time you left the emergency department.   Follow-up with your primary doctor in 2-3 days regarding your visit.    Return immediately to the emergency department if you experience any of the following: Fainting, chest pain, shortness of breath, or any other concerning symptoms.    Thank you for visiting our Emergency Department. It was a pleasure taking care of you today.

## 2024-09-20 NOTE — ED Notes (Signed)
 Patient ambulated from bed to chair without difficulty

## 2024-09-20 NOTE — ED Triage Notes (Signed)
 Pt c/o dizziness x 4 days, weakness x 4 days, sudden onset.   Sore throat, resolved appx 4 days ago. Pt states voice is hoarse.   AOx4.  Pt very unsteady at time of triage. Has appt with neurologist on Friday regarding headaches.

## 2024-09-30 ENCOUNTER — Other Ambulatory Visit

## 2024-10-09 ENCOUNTER — Ambulatory Visit
Admission: RE | Admit: 2024-10-09 | Discharge: 2024-10-09 | Disposition: A | Source: Ambulatory Visit | Attending: Neurology

## 2024-10-09 ENCOUNTER — Ambulatory Visit
Admission: RE | Admit: 2024-10-09 | Discharge: 2024-10-09 | Disposition: A | Discharge status: Home / Self Care | Source: Ambulatory Visit | Attending: Neurology | Admitting: Neurology

## 2024-10-09 DIAGNOSIS — G44229 Chronic tension-type headache, not intractable: Secondary | ICD-10-CM

## 2024-10-10 MED ORDER — CLOPIDOGREL BISULFATE 75 MG PO TABS
75.0000 mg | ORAL_TABLET | Freq: Every day | ORAL | 0 refills | Status: DC
Start: 2024-10-10 — End: 2024-10-24

## 2024-10-10 NOTE — Progress Notes (Signed)
 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  Please advise him to start Plavix with the aspirin for a total of 21 days then continue with daily aspirin. I will send a prescription. Please remind patient to keep any upcoming appointments or tests and to call us  with any interim questions, concerns, problems or updates. Thanks,   Pastor Falling, MD

## 2024-10-10 NOTE — Progress Notes (Signed)
 Please call and advise the patient that the recent angiogram (study of the blood vessels) did not show any severe narrowing. No further action is required on this test at this time. Please remind patient to keep any upcoming appointments or tests and to call us  with any interim questions, concerns, problems or updates. Thanks,   Pastor Falling, MD

## 2024-10-10 NOTE — Progress Notes (Signed)
 Please call patient and advise him to stop the Prednisone . I had called him on Sept 12 and told him to stop using the prednisone .

## 2024-10-10 NOTE — Telephone Encounter (Signed)
 Pt is asking for a call back to discuss further the information the CMA called to relay, and concern about morning facial swelling.

## 2024-10-21 ENCOUNTER — Encounter (HOSPITAL_COMMUNITY): Payer: Self-pay | Admitting: Emergency Medicine

## 2024-10-21 ENCOUNTER — Other Ambulatory Visit: Payer: Self-pay

## 2024-10-21 ENCOUNTER — Emergency Department (HOSPITAL_COMMUNITY)
Admission: EM | Admit: 2024-10-21 | Discharge: 2024-10-21 | Disposition: A | Discharge status: Home / Self Care | Attending: Emergency Medicine | Admitting: Emergency Medicine

## 2024-10-21 DIAGNOSIS — Z7901 Long term (current) use of anticoagulants: Secondary | ICD-10-CM | POA: Diagnosis not present

## 2024-10-21 DIAGNOSIS — I10 Essential (primary) hypertension: Secondary | ICD-10-CM | POA: Insufficient documentation

## 2024-10-21 DIAGNOSIS — Z7982 Long term (current) use of aspirin: Secondary | ICD-10-CM | POA: Diagnosis not present

## 2024-10-21 DIAGNOSIS — Z79899 Other long term (current) drug therapy: Secondary | ICD-10-CM | POA: Insufficient documentation

## 2024-10-21 DIAGNOSIS — I160 Hypertensive urgency: Secondary | ICD-10-CM | POA: Diagnosis not present

## 2024-10-21 DIAGNOSIS — R040 Epistaxis: Secondary | ICD-10-CM | POA: Insufficient documentation

## 2024-10-21 LAB — COMPREHENSIVE METABOLIC PANEL WITH GFR
ALT: 49 U/L — ABNORMAL HIGH (ref 0–44)
AST: 39 U/L (ref 15–41)
Albumin: 3.1 g/dL — ABNORMAL LOW (ref 3.5–5.0)
Alkaline Phosphatase: 64 U/L (ref 38–126)
Anion gap: 11 (ref 5–15)
BUN: 15 mg/dL (ref 8–23)
CO2: 29 mmol/L (ref 22–32)
Calcium: 9 mg/dL (ref 8.9–10.3)
Chloride: 92 mmol/L — ABNORMAL LOW (ref 98–111)
Creatinine, Ser: 1.28 mg/dL — ABNORMAL HIGH (ref 0.61–1.24)
GFR, Estimated: 55 mL/min — ABNORMAL LOW (ref >60)
Glucose, Bld: 260 mg/dL — ABNORMAL HIGH (ref 70–99)
Potassium: 4 mmol/L (ref 3.5–5.1)
Sodium: 132 mmol/L — ABNORMAL LOW (ref 135–145)
Total Bilirubin: 1.2 mg/dL (ref 0.0–1.2)
Total Protein: 6.7 g/dL (ref 6.5–8.1)

## 2024-10-21 LAB — CBC WITH DIFFERENTIAL/PLATELET
Abs Immature Granulocytes: 0.04 K/uL (ref 0.00–0.07)
Basophils Absolute: 0 K/uL (ref 0.0–0.1)
Basophils Relative: 0 %
Eosinophils Absolute: 0 K/uL (ref 0.0–0.5)
Eosinophils Relative: 1 %
HCT: 49.2 % (ref 39.0–52.0)
Hemoglobin: 15.3 g/dL (ref 13.0–17.0)
Immature Granulocytes: 1 %
Lymphocytes Relative: 22 %
Lymphs Abs: 1.2 K/uL (ref 0.7–4.0)
MCH: 22 pg — ABNORMAL LOW (ref 26.0–34.0)
MCHC: 31.1 g/dL (ref 30.0–36.0)
MCV: 70.8 fL — ABNORMAL LOW (ref 80.0–100.0)
Monocytes Absolute: 0.8 K/uL (ref 0.1–1.0)
Monocytes Relative: 15 %
Neutro Abs: 3.2 K/uL (ref 1.7–7.7)
Neutrophils Relative %: 61 %
Platelets: 149 K/uL — ABNORMAL LOW (ref 150–400)
RBC: 6.95 MIL/uL — ABNORMAL HIGH (ref 4.22–5.81)
RDW: 19 % — ABNORMAL HIGH (ref 11.5–15.5)
WBC: 5.3 K/uL (ref 4.0–10.5)
nRBC: 0 % (ref 0.0–0.2)

## 2024-10-21 MED ORDER — METOPROLOL TARTRATE 25 MG PO TABS
25.0000 mg | ORAL_TABLET | Freq: Once | ORAL | Status: AC
  Administered 2024-10-21: 25 mg via ORAL
  Filled 2024-10-21: qty 1

## 2024-10-21 MED ORDER — OXYMETAZOLINE HCL 0.05 % NA SOLN
1.0000 | Freq: Once | NASAL | Status: AC
  Administered 2024-10-21: 1 via NASAL
  Filled 2024-10-21: qty 30

## 2024-10-21 MED ORDER — TRANEXAMIC ACID FOR EPISTAXIS
500.0000 mg | Freq: Once | TOPICAL | Status: AC
  Administered 2024-10-21: 500 mg via TOPICAL
  Filled 2024-10-21: qty 10

## 2024-10-21 NOTE — ED Provider Notes (Signed)
  Physical Exam  BP (!) 152/67   Pulse 67   Temp 97.6 F (36.4 C)   Resp (!) 21   SpO2 96%   Physical Exam Vitals and nursing note reviewed.  HENT:     Head: Normocephalic and atraumatic.     Nose:     Comments: No active epistaxis Eyes:     Pupils: Pupils are equal, round, and reactive to light.  Cardiovascular:     Rate and Rhythm: Normal rate and regular rhythm.  Pulmonary:     Effort: Pulmonary effort is normal.     Breath sounds: Normal breath sounds.  Abdominal:     Palpations: Abdomen is soft.     Tenderness: There is no abdominal tenderness.  Skin:    General: Skin is warm and dry.  Neurological:     Mental Status: He is alert.  Psychiatric:        Mood and Affect: Mood normal.     Procedures  Procedures  ED Course / MDM   Clinical Course as of 10/21/24 1621  Fri Oct 21, 2024  1620 Nosebleed remains resolved.  Counseled patient on symptomatic management.  He will follow-up with PCP.  Discharging with Afrin spray here [MP]    Clinical Course User Index [MP] Pamella Ozell LABOR, DO   Medical Decision Making I, Ozell Pamella DO, have assumed care of this patient from the previous provider pending reevaluation of epistaxis and disposition  Amount and/or Complexity of Data Reviewed Labs: ordered.  Risk OTC drugs. Prescription drug management.          Pamella Ozell LABOR, DO 10/21/24 [phone]

## 2024-10-21 NOTE — ED Notes (Signed)
 Dried blood on pts face cleaned with washcloth, no bleeding noted from left nare at this time

## 2024-10-21 NOTE — ED Triage Notes (Addendum)
 Pt BIB [facility] for HTN with nosebleed. Began a new  med today.  Nosebleed began about 30 min after new med.  EMS gave Afrin. Bleeding controlled. Pt is belching.  Took all morning meds.   193/95 Hr 68-70, 100%, CBG 263 EMS manual BP of 230/110  Written list of meds omeprazole , Atorvastatin , Klonipin Moloxicam, prednisone , Metoprolol  tartrate, Losartan , potassium, Augmentin.

## 2024-10-21 NOTE — ED Provider Notes (Signed)
 [place] EMERGENCY DEPARTMENT AT [facility] Provider Note   CSN: [id number] Arrival date & time: 10/21/24  1040     Patient presents with: Epistaxis and Hypertension   Ronald Ramirez is a 83 y.o. male.   HPI Patient presents via EMS with concern for epistaxis. Patient is awake, alert, providing her history.  Additional details by EMS. Patient notes that he recently started some new medications including Plavix. Today approximately 30 minutes after taking his morning medications he began having bleeding from his left nostril. EMS reports patient with bleeding from the left side, and hypertensive, systolic greater than 220 on arrival.  Patient received Afrin, had hemostasis.  Patient denies any other complaints, states that he feels slightly abnormal without specificity.    Prior to Admission medications   Medication Sig Start Date End Date Taking? Authorizing Provider  aspirin EC (ASPIRIN 81) 81 MG tablet Take 81 mg by mouth daily. Swallow whole.    [provider]  atorvastatin  (LIPITOR) 10 MG tablet Take 1 tablet (10 mg total) by mouth at bedtime. 09/14/24   Vivienne Lonni Ingle, NP  clonazePAM (KLONOPIN) 1 MG tablet Take 1 mg by mouth as directed.    [provider]  clopidogrel (PLAVIX) 75 MG tablet Take 1 tablet (75 mg total) by mouth daily for 21 days. 10/10/24 10/31/24  Camara, Amadou, MD  DM-APAP-CPM (CORICIDIN HBP PO) Take by mouth as needed.    [provider]  hydrochlorothiazide  (HYDRODIURIL ) 25 MG tablet Take 1 tablet (25 mg total) by mouth daily. 08/30/24   End, Lonni, MD  insulin NPH-regular Human (70-30) 100 UNIT/ML injection Inject 25 Units into the skin 2 (two) times daily with a meal.    [provider]  JARDIANCE 10 MG TABS tablet Take 10 mg by mouth daily. 11/09/23   [provider]  losartan  (COZAAR ) 100 MG tablet Take 1 tablet (100 mg total) by mouth daily. 09/01/23   End, Lonni, MD   metoprolol  tartrate (LOPRESSOR ) 50 MG tablet TAKE 1 TABLET (50 MG) BY MOUTH IN THE MORNING AND AT BEDTIME 04/11/24   End, Lonni, MD  omeprazole  (PRILOSEC) 40 MG capsule Take 40 mg by mouth daily.    [provider]  predniSONE  (DELTASONE ) 20 MG tablet Take 40 mg by mouth daily with breakfast.    [provider]    Allergies: Lantus [insulin glargine] and Metformin and related    Review of Systems  Updated Vital Signs BP (!) 172/94   Pulse 60   Temp 97.6 F (36.4 C) (Oral)   Resp 16   SpO2 96%   Physical Exam Vitals and nursing note reviewed.  Constitutional:      General: He is not in acute distress.    Appearance: He is well-developed.  HENT:     Head: Normocephalic and atraumatic.     Nose:   Eyes:     Conjunctiva/sclera: Conjunctivae normal.  Cardiovascular:     Rate and Rhythm: Normal rate and regular rhythm.  Pulmonary:     Effort: Pulmonary effort is normal. No respiratory distress.     Breath sounds: No stridor.  Abdominal:     General: There is no distension.  Skin:    General: Skin is warm and dry.  Neurological:     Mental Status: He is alert and oriented to person, place, and time.     (all labs ordered are listed, but only abnormal results are displayed) Labs Reviewed  COMPREHENSIVE METABOLIC PANEL WITH GFR  CBC WITH DIFFERENTIAL/PLATELET    EKG: None  Radiology: No results found.   Epistaxis Management  Date/Time: 10/21/2024 3:34 PM  Performed by: Garrick Charleston, MD Authorized by: Garrick Charleston, MD   Consent:    Consent obtained:  Verbal   Consent given by:  Patient   Risks, benefits, and alternatives were discussed: yes     Risks discussed:  Bleeding, infection, nasal injury and pain   Alternatives discussed:  Alternative treatment Universal protocol:    Procedure explained and questions answered to patient or proxy's satisfaction: yes     Immediately prior to procedure, a time out was called: yes      Patient identity confirmed:  Verbally with patient Anesthesia:    Anesthesia method:  None Procedure details:    Treatment site:  R anterior   Treatment method:  Anterior pack   Treatment complexity:  Limited   Treatment episode: initial   Post-procedure details:    Assessment:  Bleeding stopped   Procedure completion:  Tolerated    Medications Ordered in the ED  metoprolol  tartrate (LOPRESSOR ) tablet 25 mg (has no administration in time range)                                    Medical Decision Making Adult male with history of hypertension, recent initiation of multiple medications presents with nosebleed, hypertension. Concern for hypertensive urgency contributing to susceptibility for bleed.  Patient has hemostasis achieved prior to arrival after Afrin was applied by EMS providers. Patient received additional antihypertensive after arrival, continuous monitoring, labs sent. Cardiac 60 sinus normal pulse ox 98% room air normal  Amount and/or Complexity of Data Reviewed Independent Historian: EMS External Data Reviewed: ECG.    Details: EMS rhythm strip sinus 64 T wave abnormalities Labs: ordered. Decision-making details documented in ED Course.  Risk OTC drugs. Prescription drug management. Decision regarding hospitalization. Diagnosis or treatment significantly limited by social determinants of health.   2:31 PM Patient had recurrence of bleeding, now packed with TXA.  This adult male presents with concern for hypertension and epistaxis.  After initial hemostasis achieved patient had 1 rebleed requiring packing with TXA.  Patient's labs reassuring, blood pressure improved with beta-blocker and after hemostasis was sustained patient appropriate for discharge with outpatient follow-up.  3:35 PM Bleeding has stopped, patient receiving phenylephrine spray to go home.  Remaining labs unremarkable, vital signs normalized, patient has no complaints.  We discussed return  precautions, follow-up instructions and we will discuss his recent change in medications including new antiplatelet regimen with his physician on follow-up.  Final diagnoses:  Epistaxis  Hypertensive urgency    ED Discharge Orders     None          Garrick Charleston, MD 10/21/24 1535

## 2024-10-21 NOTE — Discharge Instructions (Addendum)
 You were seen in the emergency room for nosebleeding Your nosebleeding resolved after applying couple medications and packing and letting rest for a while Blood pressure control is important to prevent nosebleeds as well Continue taking all previous prescribed occasions Use the nasal spray also called Afrin at home as we discussed if nosebleed happens again Blow out the clot apply the nasal spray in both nostrils and hold direct pressure for at least 15 minutes before checking to see if the bleeding has continued If the bleeding has continued despite using the nasal spray return to the emergency department or call 911 Follow-up with your primary doctor within 1 week for reevaluation

## 2024-10-24 ENCOUNTER — Other Ambulatory Visit: Payer: Self-pay

## 2024-10-24 ENCOUNTER — Emergency Department (HOSPITAL_COMMUNITY)
Admission: EM | Admit: 2024-10-24 | Discharge: 2024-10-24 | Disposition: A | Discharge status: Home / Self Care | Attending: Emergency Medicine | Admitting: Emergency Medicine

## 2024-10-24 ENCOUNTER — Other Ambulatory Visit: Payer: Self-pay | Admitting: Neurology

## 2024-10-24 DIAGNOSIS — R040 Epistaxis: Secondary | ICD-10-CM | POA: Insufficient documentation

## 2024-10-24 DIAGNOSIS — I10 Essential (primary) hypertension: Secondary | ICD-10-CM | POA: Insufficient documentation

## 2024-10-24 DIAGNOSIS — Z79899 Other long term (current) drug therapy: Secondary | ICD-10-CM | POA: Insufficient documentation

## 2024-10-24 DIAGNOSIS — Z794 Long term (current) use of insulin: Secondary | ICD-10-CM | POA: Diagnosis not present

## 2024-10-24 DIAGNOSIS — Z7902 Long term (current) use of antithrombotics/antiplatelets: Secondary | ICD-10-CM | POA: Diagnosis not present

## 2024-10-24 DIAGNOSIS — Z7982 Long term (current) use of aspirin: Secondary | ICD-10-CM | POA: Diagnosis not present

## 2024-10-24 LAB — BASIC METABOLIC PANEL WITH GFR
Anion gap: 13 (ref 5–15)
BUN: 15 mg/dL (ref 8–23)
CO2: 25 mmol/L (ref 22–32)
Calcium: 9 mg/dL (ref 8.9–10.3)
Chloride: 96 mmol/L — ABNORMAL LOW (ref 98–111)
Creatinine, Ser: 1.35 mg/dL — ABNORMAL HIGH (ref 0.61–1.24)
GFR, Estimated: 52 mL/min — ABNORMAL LOW (ref >60)
Glucose, Bld: 277 mg/dL — ABNORMAL HIGH (ref 70–99)
Potassium: 3.5 mmol/L (ref 3.5–5.1)
Sodium: 134 mmol/L — ABNORMAL LOW (ref 135–145)

## 2024-10-24 LAB — CBC
HCT: 46.3 % (ref 39.0–52.0)
Hemoglobin: 14.9 g/dL (ref 13.0–17.0)
MCH: 22.4 pg — ABNORMAL LOW (ref 26.0–34.0)
MCHC: 32.2 g/dL (ref 30.0–36.0)
MCV: 69.7 fL — ABNORMAL LOW (ref 80.0–100.0)
Platelets: 163 K/uL (ref 150–400)
RBC: 6.64 MIL/uL — ABNORMAL HIGH (ref 4.22–5.81)
RDW: 18.6 % — ABNORMAL HIGH (ref 11.5–15.5)
WBC: 5.9 K/uL (ref 4.0–10.5)
nRBC: 0 % (ref 0.0–0.2)

## 2024-10-24 MED ORDER — OXYMETAZOLINE HCL 0.05 % NA SOLN
1.0000 | Freq: Once | NASAL | Status: AC
  Administered 2024-10-24: 1 via NASAL
  Filled 2024-10-24: qty 30

## 2024-10-24 MED ORDER — SILVER NITRATE-POT NITRATE 75-25 % EX MISC
1.0000 | Freq: Once | CUTANEOUS | Status: AC
  Administered 2024-10-24: 1 via TOPICAL
  Filled 2024-10-24: qty 1

## 2024-10-24 MED ORDER — OXYMETAZOLINE HCL 0.05 % NA SOLN
1.0000 | Freq: Once | NASAL | Status: DC
  Filled 2024-10-24: qty 30

## 2024-10-24 MED ORDER — CLONAZEPAM 0.5 MG PO TABS
1.0000 mg | ORAL_TABLET | Freq: Once | ORAL | Status: AC
  Administered 2024-10-24: 1 mg via ORAL
  Filled 2024-10-24: qty 2

## 2024-10-24 MED ORDER — LIDOCAINE HCL 4 % EX SOLN
Freq: Once | CUTANEOUS | Status: AC
Start: 2024-10-24 — End: 2024-10-24
  Filled 2024-10-24: qty 50

## 2024-10-24 NOTE — ED Notes (Signed)
 PT using urinal at this time.

## 2024-10-24 NOTE — ED Provider Notes (Signed)
 [place] EMERGENCY DEPARTMENT AT [facility] Provider Note   CSN: [id number] Arrival date & time: 10/24/24  9180     Patient presents with: Epistaxis   Ronald Ramirez is a 83 y.o. male.    Epistaxis Patient presents with right-sided nosebleed.  Was seen 3 days ago for the same.  Had packing with TXA while in the ER at that time.  Bleeding and pain controlled.  Had been doing well bleeding last until today.  Now bleeding still out of the right nostril.  No lightheadedness dizziness.  Blood pressures elevated upon arrival.     Prior to Admission medications   Medication Sig Start Date End Date Taking? Authorizing Provider  aspirin EC (ASPIRIN 81) 81 MG tablet Take 81 mg by mouth daily. Swallow whole.    [provider]  atorvastatin  (LIPITOR) 10 MG tablet Take 1 tablet (10 mg total) by mouth at bedtime. 09/14/24   Vivienne Lonni Ingle, NP  clonazePAM (KLONOPIN) 1 MG tablet Take 1 mg by mouth as directed.    [provider]  clopidogrel (PLAVIX) 75 MG tablet Take 1 tablet (75 mg total) by mouth daily for 21 days. 10/10/24 10/31/24  Camara, Amadou, MD  DM-APAP-CPM (CORICIDIN HBP PO) Take by mouth as needed.    [provider]  hydrochlorothiazide  (HYDRODIURIL ) 25 MG tablet Take 1 tablet (25 mg total) by mouth daily. 08/30/24   End, Lonni, MD  insulin NPH-regular Human (70-30) 100 UNIT/ML injection Inject 25 Units into the skin 2 (two) times daily with a meal.    [provider]  JARDIANCE 10 MG TABS tablet Take 10 mg by mouth daily. 11/09/23   [provider]  losartan  (COZAAR ) 100 MG tablet Take 1 tablet (100 mg total) by mouth daily. 09/01/23   End, Lonni, MD  metoprolol  tartrate (LOPRESSOR ) 50 MG tablet TAKE 1 TABLET (50 MG) BY MOUTH IN THE MORNING AND AT BEDTIME 04/11/24   End, Lonni, MD  omeprazole  (PRILOSEC) 40 MG capsule Take 40 mg by mouth daily.    [provider]  predniSONE  (DELTASONE ) 20 MG  tablet Take 40 mg by mouth daily with breakfast.    [provider]    Allergies: Lantus [insulin glargine] and Metformin and related    Review of Systems  HENT:  Positive for nosebleeds.     Updated Vital Signs BP (!) 166/90   Pulse 90   Temp 98.1 F (36.7 C) (Oral)   Resp 18   Ht 6' (1.829 m)   Wt 86.2 kg   SpO2 99%   BMI 25.77 kg/m   Physical Exam  (all labs ordered are listed, but only abnormal results are displayed) Labs Reviewed  BASIC METABOLIC PANEL WITH GFR - Abnormal; Notable for the following components:      Result Value   Sodium 134 (*)    Chloride 96 (*)    Glucose, Bld 277 (*)    Creatinine, Ser 1.35 (*)    GFR, Estimated 52 (*)    All other components within normal limits  CBC - Abnormal; Notable for the following components:   RBC 6.64 (*)    MCV 69.7 (*)    MCH 22.4 (*)    RDW 18.6 (*)    All other components within normal limits    EKG: None  Radiology: No results found.   Epistaxis Management  Date/Time: 10/24/2024 10:01 AM  Performed by: Patsey Lot, MD Authorized by: Patsey Lot, MD   Consent:  Consent obtained:  Verbal   Consent given by:  Patient   Risks discussed:  Bleeding Universal protocol:    Patient identity confirmed:  Verbally with patient Anesthesia:    Anesthesia method:  Topical application   Topical anesthesia: 4% lidocaine. Procedure details:    Treatment site:  R anterior   Treatment method:  Silver nitrate   Treatment complexity:  Limited   Treatment episode: recurring   Post-procedure details:    Assessment:  Bleeding stopped   Procedure completion:  Tolerated well, no immediate complications    Medications Ordered in the ED  lidocaine (XYLOCAINE) 4 % external solution (has no administration in time range)  silver nitrate applicators applicator 1 Application (has no administration in time range)  oxymetazoline (AFRIN) 0.05 % nasal spray 1 spray (1 spray Each Nare Given 10/24/24  0923)                                    Medical Decision Making Amount and/or Complexity of Data Reviewed Labs: ordered.  Risk OTC drugs. Prescription drug management.   Patient with right-sided epistaxis.  Recently seen for the same.  Differential diagnosis includes various kinds of epistaxis particular anterior versus posterior.  Recently started on Plavix which would increase the risk of bleeding.  Did have anterior bleeding which was controlled and then silver nitrate applied.  Bleeding stable.  Has had hypertension.  Blood pressure initially over 200 systolic.  Come down to 170/90.  Will need short-term follow-up with PCP or potentially cardiology.  For now since blood pressure coming down after morning dose will not increase medicine particularly since most recent both neurology and cardiology appointment had reassuring blood pressure.     Final diagnoses:  Right-sided epistaxis  Hypertension, unspecified type    ED Discharge Orders     None          Patsey Lot, MD 10/24/24 1019

## 2024-10-24 NOTE — Discharge Instructions (Addendum)
 Your blood pressure is elevated but has been coming down.  Follow-up with your primary care doctor to see if they believe you need further adjustment of your medication.  Stop the Plavix for the next week.  Discussed with Dr. Gregg about continuing the medicine or not.  If you continue to have more nosebleeds you can follow-up with the ear nose and throat doctor.

## 2024-10-24 NOTE — ED Notes (Signed)
 PT discharged to home. Breathing is even and unlabored. PT educated on follow up. PT to follow up as directed. PT to follow up as directed.

## 2024-10-24 NOTE — ED Triage Notes (Addendum)
 Pt here for nose bleed starting about 30 mins ago. Bleeding controlled and packed with guaze at this time. Pt denies blood thinners

## 2024-10-24 NOTE — ED Notes (Signed)
 PER PT he will not leave until he gets more nasal spray

## 2024-10-31 ENCOUNTER — Telehealth: Payer: Self-pay | Admitting: Neurology

## 2024-10-31 ENCOUNTER — Telehealth: Payer: Self-pay

## 2024-10-31 NOTE — Telephone Encounter (Signed)
 Copied from CRM [id number]. Topic: Appointments - Scheduling Inquiry for Clinic >> Oct 31, 2024 12:14 PM Mesmerise C wrote: Reason for CRM: Patient needs a new pcp and trying to establish care daughter in law Rollo Mace currently sees Glade Hope and would like him to become a patient of hers as well can be reached at [phone]

## 2024-10-31 NOTE — Telephone Encounter (Signed)
 I called and was not able to [facility] that returned call.  We do not prescribe atorvastatin  , this is his pcp Ananias Meager NP)

## 2024-10-31 NOTE — Telephone Encounter (Signed)
 Pt was at [facility] and states they want him to come off of the atorvastatin  (LIPITOR) 10 MG tablet , please call pt to discuss.

## 2024-11-01 NOTE — Telephone Encounter (Signed)
 Thank you for clarification.  Clopidogrel rx by neurologist following our last appt due to abnormal MRI findings.  No further cardiology input required.

## 2024-11-01 NOTE — Telephone Encounter (Signed)
I am currently not accepting new patients 

## 2024-11-01 NOTE — Telephone Encounter (Signed)
 Called patient for further information regarding his message - he was in the ER 2x the first if November due to nose bleed that required silver nitrate - patient recently started on plavix ( 10/10/2024 ) asa was ordered on 09/14/2024 per chart - plavix stopped 10/31/24 per Dr. Gregg - uncertain if patient had been taking ASA and Plavix concurrently  Please advise -   Last OV card - 09/14/24

## 2024-11-24 ENCOUNTER — Other Ambulatory Visit: Payer: Self-pay | Admitting: Neurology

## 2024-11-30 ENCOUNTER — Encounter: Payer: Self-pay | Admitting: Emergency Medicine

## 2024-11-30 ENCOUNTER — Ambulatory Visit: Admitting: Emergency Medicine

## 2024-11-30 VITALS — BP 136/84 | HR 62 | Temp 97.5°F | Ht 72.0 in | Wt 198.0 lb

## 2024-11-30 DIAGNOSIS — R911 Solitary pulmonary nodule: Secondary | ICD-10-CM | POA: Diagnosis not present

## 2024-11-30 DIAGNOSIS — R0789 Other chest pain: Secondary | ICD-10-CM

## 2024-11-30 NOTE — Assessment & Plan Note (Signed)
 Patient scribes intermittent chest discomfort, soreness as well as left shoulder discomfort.  Based on his description sounds somewhat atypical in nature but I do think he should at least discuss with cardiology so they can determine whether he needs any other evaluation.  He has seen Medford Meager before and we will try to get him in sooner than his usual scheduled appointment

## 2024-11-30 NOTE — Patient Instructions (Addendum)
 We will repeat your CT scan of the chest to compare with September 2024. We will arrange sooner follow-up for you with cardiology to discuss the chest discomfort that you have experienced. Follow Dr. Shelah next available after your CT so we can review those results together

## 2024-11-30 NOTE — Assessment & Plan Note (Signed)
 Possible slight interval increase in the size of his 1.2 cm right upper lobe nodule.  There is now also a 6 mm satellite nodule.  He is overdue for follow-up imaging and we will arrange for this now.  Follow-up to review next available after completed.  If there is suspicion for malignancy then we will discuss possible diagnostics.

## 2024-11-30 NOTE — Progress Notes (Signed)
 Subjective:    Patient ID: Ronald Ramirez, male    DOB: 31-Mar-1941, 83 y.o.   MRN: [id number]  HPI  ROV 11/30/2024 --follow-up visit 83 year old gentleman, never smoker with hypertension, hyperlipidemia, diabetes, peripheral vascular disease, cervical neuropathy.  I have followed him for a 1.5 cm right upper lobe pulmonary nodule in the posterior right upper lobe initially recognized on CT scan of the cervical spine 05/2022. Was in ED with dizziness and cough in September.  He has been evaluated recently for epistaxis.  Today he reports that he has had some poorly controlled HTN. He is followed by Cardiology. Has been experiencing some intermittent L chest discomfort to his shoulder..   CT chest 08/26/23 reviewed by me, showed no mediastinal or hilar adenopathy, 1.2 x 0.9 cm right upper lobe nodule stable to possibly slightly more prominent than his priors dating back to 06/2022.  There is a new nodule medial to this measuring 6 mm   Review of Systems As per HPI  Past Medical History:  Diagnosis Date   Anxiety    Chest pain    a. 10/2008 MV: Ex time 5:01, low risk w/o ischemia; b. 06/2017 MV: EF 53%, inflat fixed defect, no ischemia->low risk; c. 06/2021 MV: EF 63%, no ischemia->low risk.   CKD (chronic kidney disease), stage II    Diabetes mellitus without complication (HCC)    Hypertension    Occasional tremors    PVC's (premature ventricular contractions)   Hyperlipidemia   Family History  Problem Relation Age of Onset   Diabetes Sister    Bleeding Disorder Sister    Stroke Son     No family hx lung CA.   Social History   Socioeconomic History   Marital status: Widowed    Spouse name: Not on file   Number of children: Not on file   Years of education: Not on file   Highest education level: Not on file  Occupational History   Not on file  Tobacco Use   Smoking status: Never   Smokeless tobacco: Never  Vaping Use   Vaping status: Never Used  Substance and Sexual  Activity   Alcohol use: No   Drug use: No   Sexual activity: Not on file  Other Topics Concern   Not on file  Social History Narrative   Lives alone    Right handed   Caffeine: none    Social Drivers of Health   Financial Resource Strain: Not on file  Food Insecurity: Not on file  Transportation Needs: Not on file  Physical Activity: Not on file  Stress: Not on file  Social Connections: Not on file  Intimate Partner Violence: Not on file    Has worked [employer], sports coach, 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 Swallow whole.     atorvastatin  (LIPITOR) 10 MG tablet Take 1 tablet (10 mg total) by mouth at bedtime. 90 tablet 3   clonazePAM  (KLONOPIN ) 1 MG tablet Take 1 mg by mouth as directed.     DM-APAP-CPM (CORICIDIN HBP PO) Take by mouth as needed.     hydrochlorothiazide  (HYDRODIURIL ) 25 MG tablet Take 1 tablet (25 mg total) by mouth daily. 90 tablet 2   insulin NPH-regular Human (70-30)  100 UNIT/ML injection Inject 25 Units into the skin 2 (two) times daily with a meal.     JARDIANCE 10 MG TABS tablet Take 10 mg by mouth daily.     losartan  (COZAAR ) 100 MG tablet Take 1 tablet (100 mg total) by mouth daily. 30 tablet 0   metoprolol  tartrate (LOPRESSOR ) 50 MG tablet TAKE 1 TABLET (50 MG) BY MOUTH IN THE MORNING AND AT BEDTIME 180 tablet 3   omeprazole  (PRILOSEC) 40 MG capsule Take 40 mg by mouth daily.     predniSONE  (DELTASONE ) 20 MG tablet Take 40 mg by mouth daily with breakfast.     No facility-administered medications prior to visit.        Objective:   Physical Exam  Vitals:   11/30/24 1346  BP: 136/84  Pulse: 62  Temp: (!) 97.5 F (36.4 C)  TempSrc: Oral  SpO2: 98%  Weight: 198 lb (89.8 kg)  Height: 6'  (1.829 m)     Gen: Pleasant, well-nourished, in no distress,  normal affect  ENT: No lesions,  mouth clear,  oropharynx clear, no postnasal drip  Neck: No JVD, no stridor  Lungs: No use of accessory muscles, no crackles or wheezing on normal respiration, no wheeze on forced expiration  Cardiovascular: RRR, heart sounds normal, no murmur or gallops, no peripheral edema  Musculoskeletal: No deformities, no cyanosis or clubbing  Neuro: alert, awake, non focal  Skin: Warm, no lesions or rash     Assessment & Plan:   Atypical chest pain Patient scribes intermittent chest discomfort, soreness as well as left shoulder discomfort.  Based on his description sounds somewhat atypical in nature but I do think he should at least discuss with cardiology so they can determine whether he needs any other evaluation.  He has seen Medford Meager before and we will try to get him in sooner than his usual scheduled appointment  Pulmonary nodule Possible slight interval increase in the size of his 1.2 cm right upper lobe nodule.  There is now also a 6 mm satellite nodule.  He is overdue for follow-up imaging and we will arrange for this now.  Follow-up to review next available after completed.  If there is suspicion for malignancy then we will discuss possible diagnostics.    Lamar Chris, MD, PhD 11/30/2024, 2:21 PM [facility] [phone] or if no answer before 7:00PM call [phone] For any issues after 7:00PM please call eLink [phone]

## 2024-12-07 ENCOUNTER — Inpatient Hospital Stay
Admission: RE | Admit: 2024-12-07 | Discharge: 2024-12-07 | Attending: Emergency Medicine | Admitting: Emergency Medicine

## 2024-12-07 DIAGNOSIS — R911 Solitary pulmonary nodule: Secondary | ICD-10-CM

## 2024-12-12 ENCOUNTER — Telehealth: Payer: Self-pay | Admitting: Internal Medicine

## 2024-12-12 MED ORDER — HYDROCHLOROTHIAZIDE 25 MG PO TABS: 25.0000 mg | ORAL_TABLET | Freq: Every day | ORAL | 0 refills | Status: AC

## 2024-12-12 NOTE — Telephone Encounter (Signed)
 Requested Prescriptions   Signed Prescriptions Disp Refills   hydrochlorothiazide  (HYDRODIURIL ) 25 MG tablet 90 tablet 0    Sig: Take 1 tablet (25 mg total) by mouth daily.    Authorizing Provider: END, CHRISTOPHER    Ordering User: Jackilyn Umphlett  C

## 2024-12-12 NOTE — Telephone Encounter (Signed)
" °*  STAT* If patient is at the pharmacy, call can be transferred to refill team.   1. Which medications need to be refilled? (please list name of each medication and dose if known) hydrochlorothiazide  (HYDRODIURIL ) 25 MG tablet    2. Would you like to learn more about the convenience, safety, & potential cost savings by using the [facility]? No    3. Are you open to using the [facility] (Type [facility]. No    4. Which pharmacy/location (including street and city if local pharmacy) is medication to be sent to? [id number] - [place], [place] - [address]     5. Do they need a 30 day or 90 day supply? 90 day    "

## 2024-12-28 ENCOUNTER — Ambulatory Visit: Admitting: Emergency Medicine

## 2024-12-28 ENCOUNTER — Encounter: Payer: Self-pay | Admitting: Emergency Medicine

## 2024-12-28 VITALS — BP 126/64 | HR 66 | Ht 72.0 in | Wt 194.2 lb

## 2024-12-28 DIAGNOSIS — R911 Solitary pulmonary nodule: Secondary | ICD-10-CM | POA: Diagnosis not present

## 2024-12-28 NOTE — Assessment & Plan Note (Signed)
 The right upper lobe ground glass nodule appears to be stable.  The satellite 6 mm nodule has resolved.  Overall reassuring.  Plan to repeat his CT scan of the chest at the 1 year mark which would be December 2026.  Follow-up with me after that scan so we can review.  If there is an interval change or suspicion for malignancy increases then would recommend bronchoscopy.

## 2024-12-28 NOTE — Patient Instructions (Signed)
 We reviewed your CT scan of the chest from December 2025.  This shows that your right upper lobe nodule is stable and there has been some improvement.  Good news. We need to repeat your CT scan of the chest in December 2026 to ensure no changes. Follow Dr. Shelah so we can review your scan at that time

## 2024-12-28 NOTE — Progress Notes (Signed)
 "  Subjective:    Patient ID: Ronald Ramirez, male    DOB: 09/20/1941, 84 y.o.   MRN: [id number]  HPI  ROV 11/30/2024 --follow-up visit 84 year old gentleman, never smoker with hypertension, hyperlipidemia, diabetes, peripheral vascular disease, cervical neuropathy.  I have followed him for a 1.5 cm right upper lobe pulmonary nodule in the posterior right upper lobe initially recognized on CT scan of the cervical spine 05/2022. Was in ED with dizziness and cough in September.  He has been evaluated recently for epistaxis.  Today he reports that he has had some poorly controlled HTN. He is followed by Cardiology. Has been experiencing some intermittent L chest discomfort to his shoulder..   CT chest 08/26/23 reviewed by me, showed no mediastinal or hilar adenopathy, 1.2 x 0.9 cm right upper lobe nodule stable to possibly slightly more prominent than his priors dating back to 06/2022.  There is a new nodule medial to this measuring 6 mm  ROV 12/28/2024 --Ronald Ramirez is 84, never smoker with a history of hypertension, hyperlipidemia, diabetes, PVD and cervical neuropathy.  I have followed him for an abnormal CT scan of the chest with pulmonary nodular disease.  When I saw him last month he was having some atypical chest pain and I referred him back to see cardiology.  We also reviewed his CT scan of the chest that showed a possible slight increase in a 1.2 cm right upper lobe pulmonary nodule with an associated 6 mm satellite nodule.  We repeated his CT as below.  He began to have some URI sx with associated cough beginning over a week ago., he was treated with azithro and is feeling better.   CT scan of the chest 12/07/2024 reviewed by me showed 1.3 x 0.7 cm solid pulmonary nodule with some surrounding ground glass halo in the right upper lobe, overall stable compared with his priors.  The 6 mm satellite nodule was resolved.   Review of Systems As per HPI  Past Medical History:  Diagnosis Date    Anxiety    Chest pain    a. 10/2008 MV: Ex time 5:01, low risk w/o ischemia; b. 06/2017 MV: EF 53%, inflat fixed defect, no ischemia->low risk; c. 06/2021 MV: EF 63%, no ischemia->low risk.   CKD (chronic kidney disease), stage II    Diabetes mellitus without complication (HCC)    Hypertension    Occasional tremors    PVC's (premature ventricular contractions)   Hyperlipidemia   Family History  Problem Relation Age of Onset   Diabetes Sister    Bleeding Disorder Sister    Stroke Son     No family hx lung CA.   Social History   Socioeconomic History   Marital status: Widowed    Spouse name: Not on file   Number of children: Not on file   Years of education: Not on file   Highest education level: Not on file  Occupational History   Not on file  Tobacco Use   Smoking status: Never   Smokeless tobacco: Never  Vaping Use   Vaping status: Never Used  Substance and Sexual Activity   Alcohol use: No   Drug use: No   Sexual activity: Not on file  Other Topics Concern   Not on file  Social History Narrative   Lives alone    Right handed   Caffeine: none    Social Drivers of Health   Tobacco Use: Low Risk (12/28/2024)   Patient History  Smoking Tobacco Use: Never    Smokeless Tobacco Use: Never    Passive Exposure: Not on file  Financial Resource Strain: Not on file  Food Insecurity: Not on file  Transportation Needs: Not on file  Physical Activity: Not on file  Stress: Not on file  Social Connections: Not on file  Intimate Partner Violence: Not on file  Depression (EYV7-0): Not on file  Alcohol Screen: Not on file  Housing: Not on file  Utilities: Not on file  Health Literacy: Not on file    Has worked [employer], sports coach, 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 Swallow whole.     clonazePAM  (KLONOPIN ) 1 MG tablet Take 1 mg by mouth as directed.     DM-APAP-CPM (CORICIDIN HBP PO) Take by mouth as needed.     hydrochlorothiazide  (HYDRODIURIL ) 25 MG tablet Take 1 tablet (25 mg total) by mouth daily. 90 tablet 0   insulin NPH-regular Human (70-30) 100 UNIT/ML injection Inject 25 Units into the skin 2 (two) times daily with a meal.     JARDIANCE 10 MG TABS tablet Take 10 mg by mouth daily.     losartan  (COZAAR ) 100 MG tablet Take 1 tablet (100 mg total) by mouth daily. 30 tablet 0   metoprolol  tartrate (LOPRESSOR ) 50 MG tablet TAKE 1 TABLET (50 MG) BY MOUTH IN THE MORNING AND AT BEDTIME 180 tablet 3   omeprazole  (PRILOSEC) 40 MG capsule Take 40 mg by mouth daily.     atorvastatin  (LIPITOR) 10 MG tablet Take 1 tablet (10 mg total) by mouth at bedtime. (Patient not taking: Reported on 12/28/2024) 90 tablet 3   azithromycin (ZITHROMAX) 250 MG tablet Take 250 mg by mouth once. (Patient not taking: Reported on 12/28/2024)     No facility-administered medications prior to visit.        Objective:   Physical Exam  Vitals:   12/28/24 1454  BP: 126/64  Pulse: 66  SpO2: 96%  Weight: 194 lb 3.2 oz (88.1 kg)  Height: 6' (1.829 m)     Gen: Pleasant, well-nourished, in no distress,  normal affect  ENT: No lesions,  mouth clear,  oropharynx clear, no postnasal drip  Neck: No JVD, no stridor  Lungs: No use of accessory muscles, no crackles or wheezing on normal respiration, no wheeze on forced expiration  Cardiovascular: RRR, heart sounds normal, no murmur or gallops, no peripheral edema  Musculoskeletal: No deformities, no cyanosis or clubbing  Neuro: alert, awake, non focal  Skin: Warm, no lesions or rash     Assessment & Plan:   Pulmonary nodule The right upper lobe ground glass nodule appears to be stable.  The satellite 6 mm nodule has resolved.  Overall reassuring.  Plan to repeat his CT  scan of the chest at the 1 year mark which would be December 2026.  Follow-up with me after that scan so we can review.  If there is an interval change or suspicion for malignancy increases then would recommend bronchoscopy.     Lamar Chris, MD, PhD 12/28/2024, 3:26 PM [facility] [phone] or if no answer before 7:00PM call [phone] For any issues after 7:00PM please call eLink [phone]  "

## 2025-01-18 ENCOUNTER — Telehealth: Payer: Self-pay | Admitting: Neurology

## 2025-01-18 NOTE — Telephone Encounter (Signed)
 Called pt.  He states that he is having more of the swelling in the face (not outward , more inward) as seen when in the initial visit. R > L, comes and goes.  More in the morning.  Slight headache with it. Saw [facility] no bx done.  He is on asa 81mg  daily.  He Is asking for anti- inflammatory.   I told him that he has appt for follow up here 04-10-2025 and is on waitlist.  He has had issue with epistaxis back in 10-24-2024.  Not sure what the facial swelling is from??  I relayed that can take OTC motrin to see if helps.  Would ask Dr. Gregg.

## 2025-01-18 NOTE — Telephone Encounter (Signed)
 He was on Plavix  following a stroke but had nose bleed, so Plavix  dc and not on Aspirin. His facial swelling is subjective. Agree with recs. Can go to Urgent care if needed and I will see him as scheduled in April. Please add him to the cancellation list.

## 2025-01-18 NOTE — Telephone Encounter (Signed)
 Pt called stating that MD had started him on  medication  Pt could not remember which medication it was ,However the medication was giving Pt  nose bleeds  so Pt stop taking medication.  Pt stated he is having some difficulties  now with his face swelling and his head is hurting around the temple area . Pt would like to discuss what  he could do?

## 2025-01-19 NOTE — Telephone Encounter (Signed)
 I called pt and relayed that per Dr. Gregg he can see urgent care if needed.  Can take motrin/ibuprofen if needed (take only as directed/take with food) and see if that helps.  Dr Gregg to see you in April as scheduled.  And he is on cancellation list.

## 2025-01-27 ENCOUNTER — Emergency Department (HOSPITAL_COMMUNITY)
Admission: EM | Admit: 2025-01-27 | Discharge: 2025-01-27 | Discharge status: Against Medical Advice | Source: Home / Self Care

## 2025-01-27 ENCOUNTER — Emergency Department (HOSPITAL_COMMUNITY)

## 2025-01-27 ENCOUNTER — Encounter (HOSPITAL_COMMUNITY): Payer: Self-pay | Admitting: Emergency Medicine

## 2025-01-27 ENCOUNTER — Other Ambulatory Visit: Payer: Self-pay

## 2025-01-27 LAB — RESP PANEL BY RT-PCR (RSV, FLU A&B, COVID)  RVPGX2
Influenza A by PCR: POSITIVE — AB
Influenza B by PCR: NEGATIVE
Resp Syncytial Virus by PCR: NEGATIVE
SARS Coronavirus 2 by RT PCR: NEGATIVE

## 2025-01-27 LAB — GROUP A STREP BY PCR: Group A Strep by PCR: NOT DETECTED

## 2025-01-27 NOTE — ED Triage Notes (Signed)
 Patient c/o cough, causing his throat and ears to hurt x 3-4 days.  Patient also endorses runny nose.

## 2025-01-27 NOTE — ED Notes (Signed)
 Pt stated he was tired of waiting. Pt seen leaving ED lobby.

## 2025-04-10 ENCOUNTER — Ambulatory Visit: Admitting: Neurology
